# Patient Record
Sex: Female | Born: 1975 | Race: Asian | Hispanic: No | Marital: Married | Smoking: Never smoker
Health system: Southern US, Community
[De-identification: ages and names within clinical notes are randomized; demographics above are authoritative.]

## PROBLEM LIST (undated history)

## (undated) DIAGNOSIS — K219 Gastro-esophageal reflux disease without esophagitis: Secondary | ICD-10-CM

## (undated) DIAGNOSIS — I10 Essential (primary) hypertension: Secondary | ICD-10-CM

## (undated) HISTORY — PX: INTRAUTERINE DEVICE (IUD) INSERTION: SHX5877

## (undated) HISTORY — DX: Essential (primary) hypertension: I10

## (undated) HISTORY — DX: Gastro-esophageal reflux disease without esophagitis: K21.9

---

## 1998-12-08 ENCOUNTER — Emergency Department (HOSPITAL_COMMUNITY): Admission: EM | Admit: 1998-12-08 | Discharge: 1998-12-08 | Payer: Self-pay | Admitting: Emergency Medicine

## 1998-12-08 ENCOUNTER — Encounter: Payer: Self-pay | Admitting: Emergency Medicine

## 1998-12-09 ENCOUNTER — Ambulatory Visit (HOSPITAL_COMMUNITY): Admission: RE | Admit: 1998-12-09 | Discharge: 1998-12-09 | Payer: Self-pay | Admitting: Gynecology

## 1999-01-29 ENCOUNTER — Other Ambulatory Visit: Admission: RE | Admit: 1999-01-29 | Discharge: 1999-01-29 | Payer: Self-pay | Admitting: Gynecology

## 1999-01-29 ENCOUNTER — Encounter: Payer: Self-pay | Admitting: Gynecology

## 1999-01-29 ENCOUNTER — Ambulatory Visit (HOSPITAL_COMMUNITY): Admission: RE | Admit: 1999-01-29 | Discharge: 1999-01-29 | Payer: Self-pay | Admitting: Gynecology

## 1999-08-03 ENCOUNTER — Encounter: Payer: Self-pay | Admitting: *Deleted

## 1999-08-03 ENCOUNTER — Inpatient Hospital Stay (HOSPITAL_COMMUNITY): Admission: AD | Admit: 1999-08-03 | Discharge: 1999-08-03 | Payer: Self-pay | Admitting: *Deleted

## 1999-09-03 ENCOUNTER — Inpatient Hospital Stay (HOSPITAL_COMMUNITY): Admission: AD | Admit: 1999-09-03 | Discharge: 1999-09-03 | Payer: Self-pay | Admitting: Gynecology

## 1999-09-21 ENCOUNTER — Other Ambulatory Visit: Admission: RE | Admit: 1999-09-21 | Discharge: 1999-09-21 | Payer: Self-pay | Admitting: Internal Medicine

## 1999-12-16 ENCOUNTER — Encounter: Admission: RE | Admit: 1999-12-16 | Discharge: 2000-03-15 | Payer: Self-pay | Admitting: Gynecology

## 2000-02-20 ENCOUNTER — Inpatient Hospital Stay (HOSPITAL_COMMUNITY): Admission: AD | Admit: 2000-02-20 | Discharge: 2000-02-20 | Payer: Self-pay | Admitting: Gynecology

## 2000-03-18 ENCOUNTER — Inpatient Hospital Stay (HOSPITAL_COMMUNITY): Admission: AD | Admit: 2000-03-18 | Discharge: 2000-03-18 | Payer: Self-pay | Admitting: Gynecology

## 2000-03-19 ENCOUNTER — Inpatient Hospital Stay (HOSPITAL_COMMUNITY): Admission: AD | Admit: 2000-03-19 | Discharge: 2000-03-21 | Payer: Self-pay | Admitting: Gynecology

## 2000-04-27 ENCOUNTER — Other Ambulatory Visit: Admission: RE | Admit: 2000-04-27 | Discharge: 2000-04-27 | Payer: Self-pay | Admitting: Gynecology

## 2002-07-17 ENCOUNTER — Other Ambulatory Visit: Admission: RE | Admit: 2002-07-17 | Discharge: 2002-07-17 | Payer: Self-pay | Admitting: Gynecology

## 2005-02-21 ENCOUNTER — Ambulatory Visit (HOSPITAL_COMMUNITY): Admission: RE | Admit: 2005-02-21 | Discharge: 2005-02-21 | Payer: Self-pay | Admitting: Family Medicine

## 2005-02-21 IMAGING — US US PELVIS COMPLETE MODIFY
1 series · 14 of 25 positions shown · non-contrast
Comparison: none

CLINICAL DATA: Evaluate for bleeding for retained products of conception.   Quantitative Beta HCG on [DATE] of 24 and a quantitative Beta HCG on [DATE] of 13.
 PELVIC ULTRASOUND WITH TRANSVAGINAL MODIFIER:
 Multiple images of the uterus and adnexa were obtained using transabdominal and endovaginal approaches.   The uterus has a sagittal length of 7.9 cm, and AP width of 4.1 cm and a transverse width of 5.8 cm.   A homogeneous uterine myometrium is seen.  The endometrial canal is thin with an AP width of 5.5 mm.   No definite areas of focal thickening or inhomogeneity are noted to suggest presence of retained products of conception.   
 Both ovaries are seen and have a normal appearance with the left ovary measuring 3.2 x 3.3 x 1.6 cm and the right ovary measuring 3.1 x 1.9 x 2.7 cm.   No cul-de-sac or periovarian fluid is seen and no separate adnexal masses are noted.

[Series 1: us pelvis complete modify · 0.33mm/px · 14 of 38 slices shown]
[im 1/38]
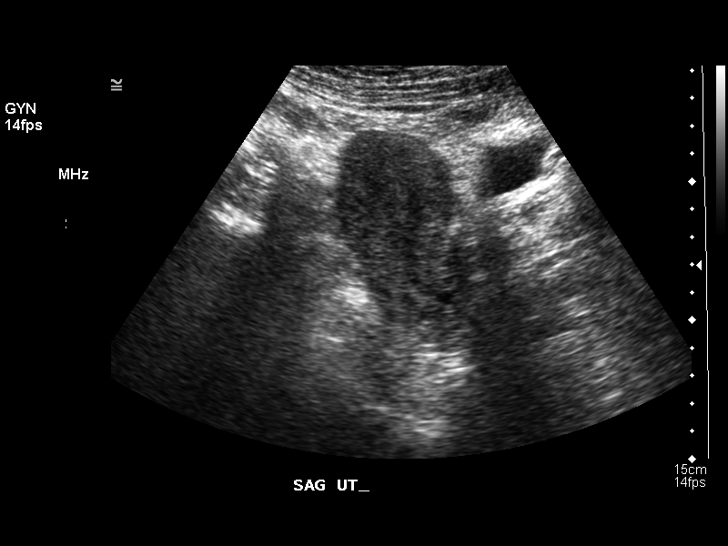
[im 4/38]
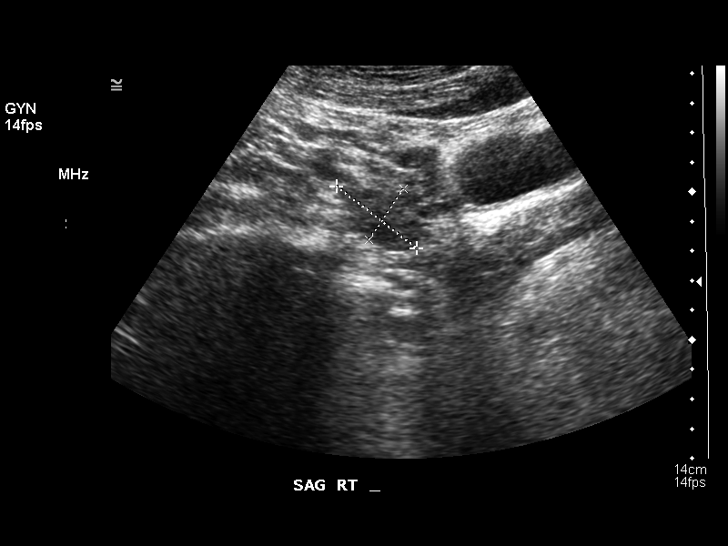
[im 7/38]
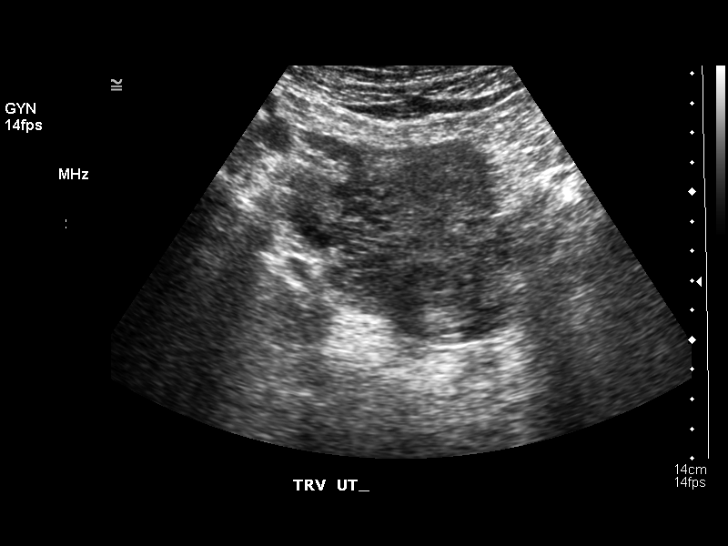
[im 10/38]
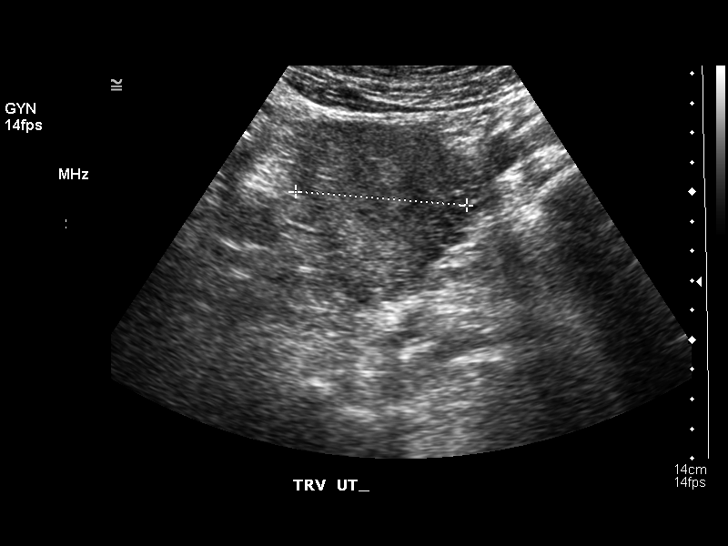
[im 13/38]
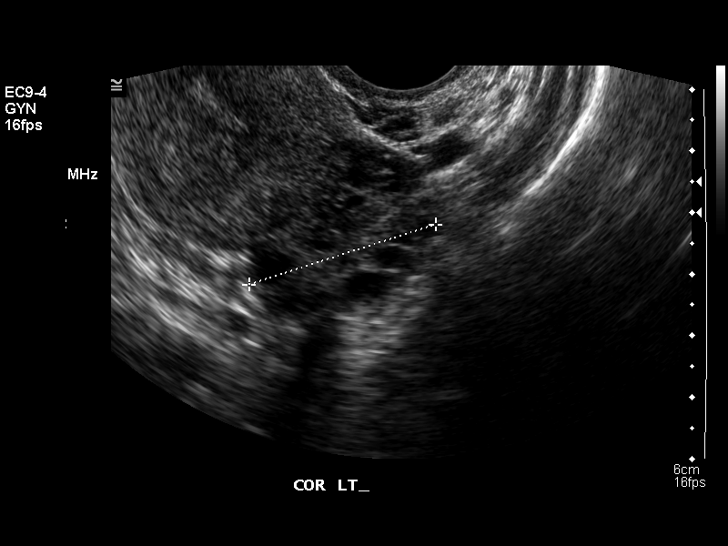
[im 14/38]
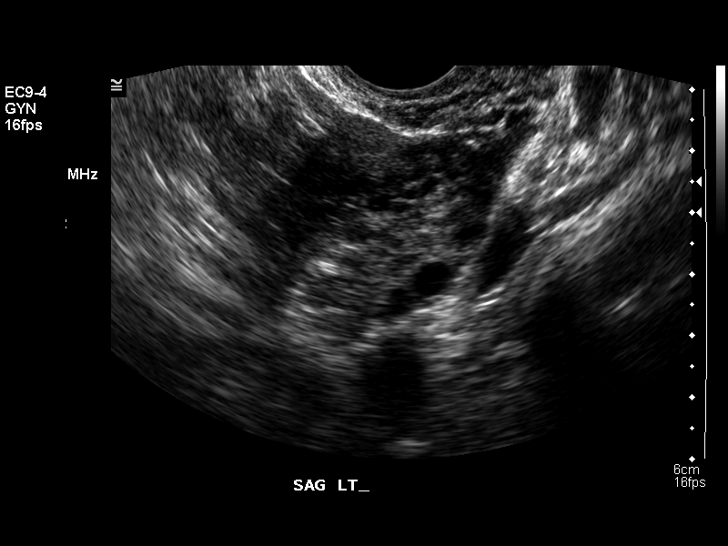
[im 17/38]
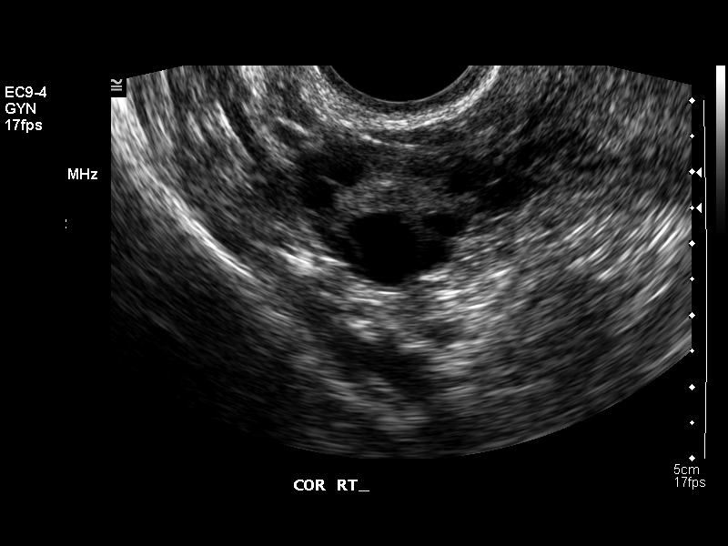
[im 21/38]
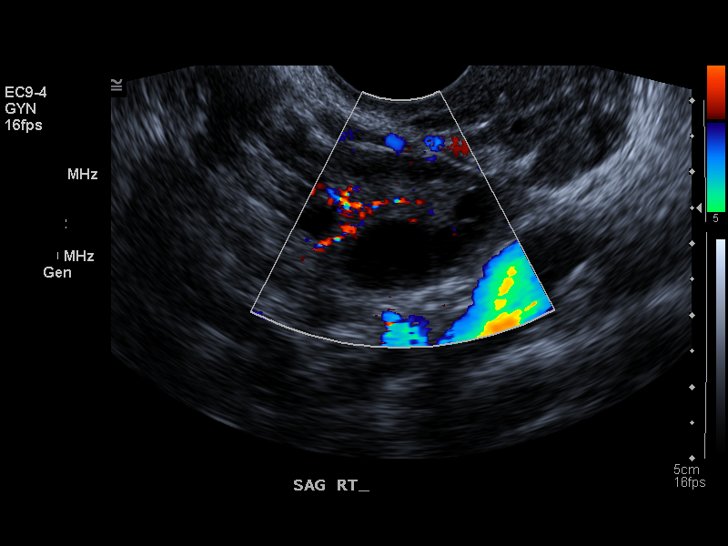
[im 24/38]
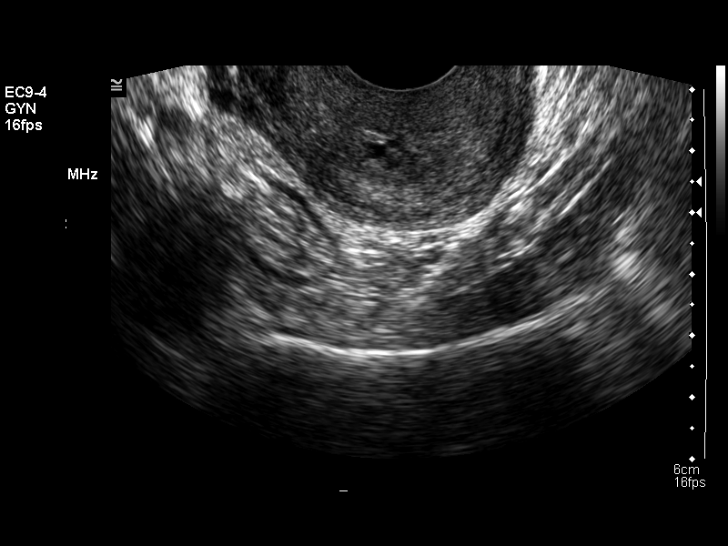
[im 25/38]
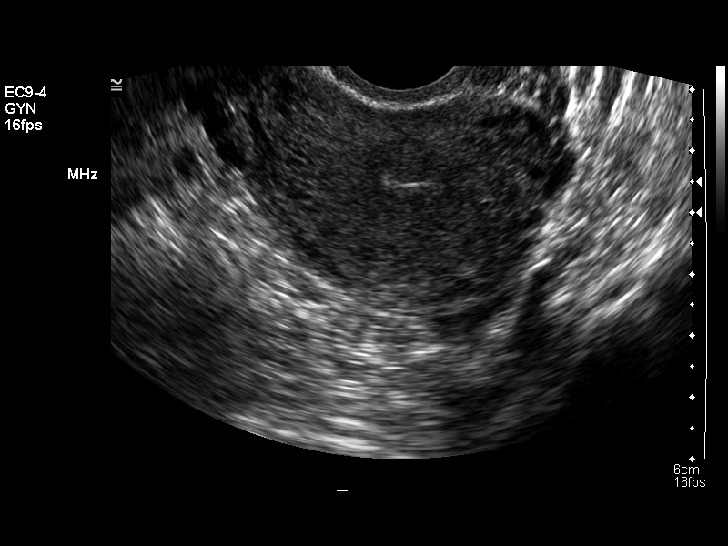
[im 28/38]
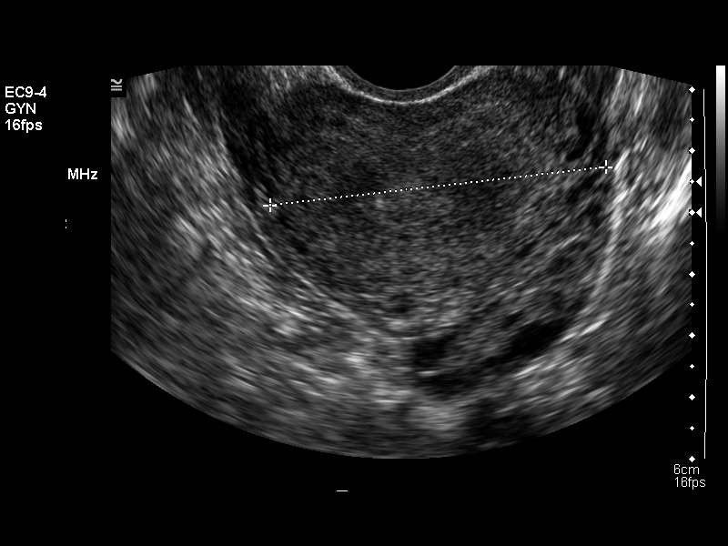
[im 31/38]
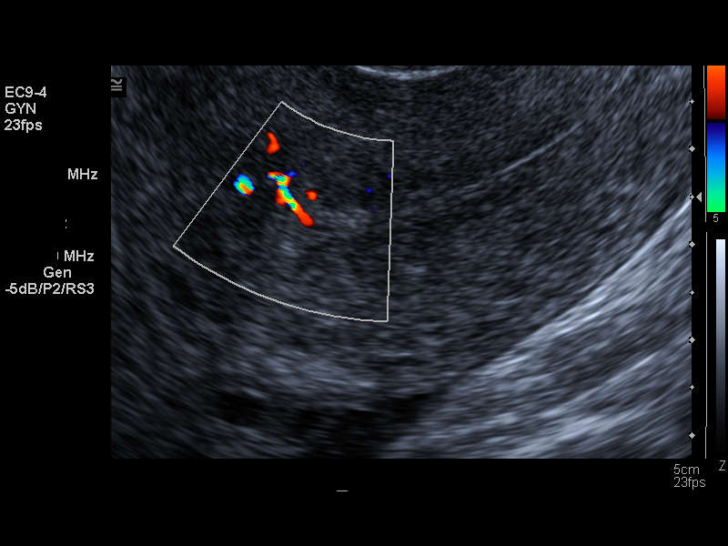
[im 34/38]
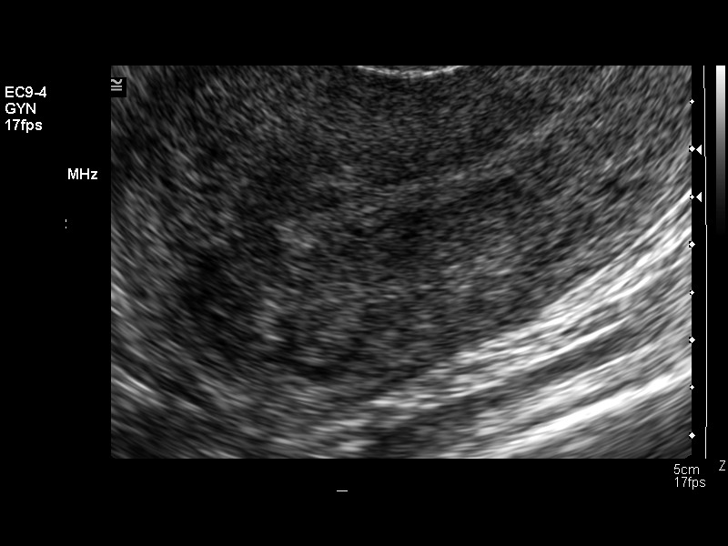
[im 38/38]
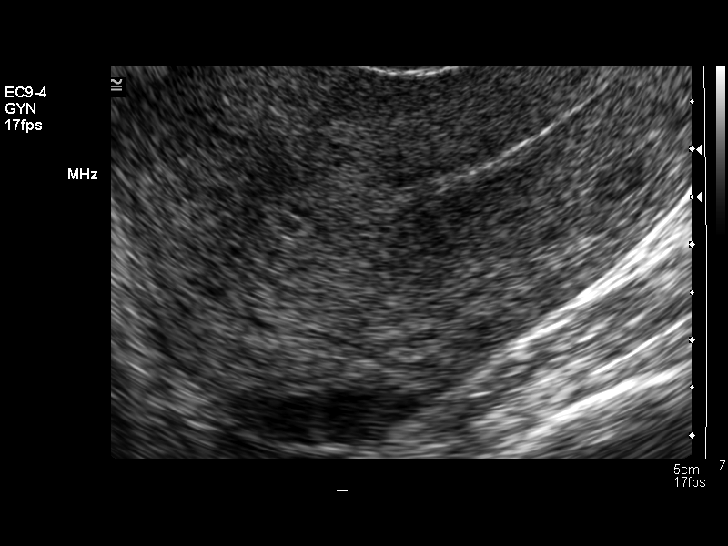

[14 of 25 positions shown; findings below may reference images not displayed]

IMPRESSION: No evidence for retained products of conception.

## 2007-12-06 ENCOUNTER — Inpatient Hospital Stay (HOSPITAL_COMMUNITY): Admission: AD | Admit: 2007-12-06 | Discharge: 2007-12-06 | Payer: Self-pay | Admitting: Obstetrics and Gynecology

## 2007-12-06 IMAGING — US US OB COMP LESS 14 WK
1 series · 14 of 28 positions shown · non-contrast
Comparison: None

CLINICAL DATA: Pregnancy; ; RULE OUT ECTOPIC

OBSTETRIC <14 WK US AND TRANSVAGINAL OB US
TECHNIQUE: Both transabdominal and transvaginal ultrasound
examinations were performed for complete evaluation of the
gestation as well as the maternal uterus, adnexal regions, and
pelvic cul-de-sac.

[Series 1: us ob comp less 14 wk · 0.23mm/px · 14 of 34 slices shown]
[im 2/34]
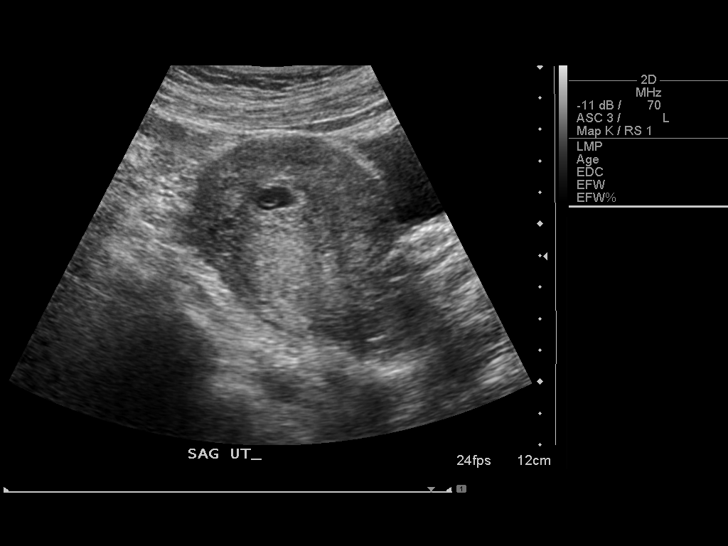
[im 4/34]
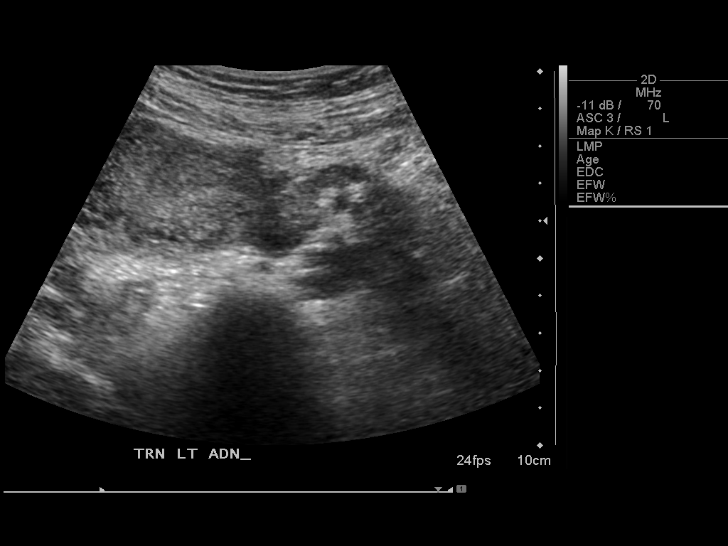
[im 7/34]
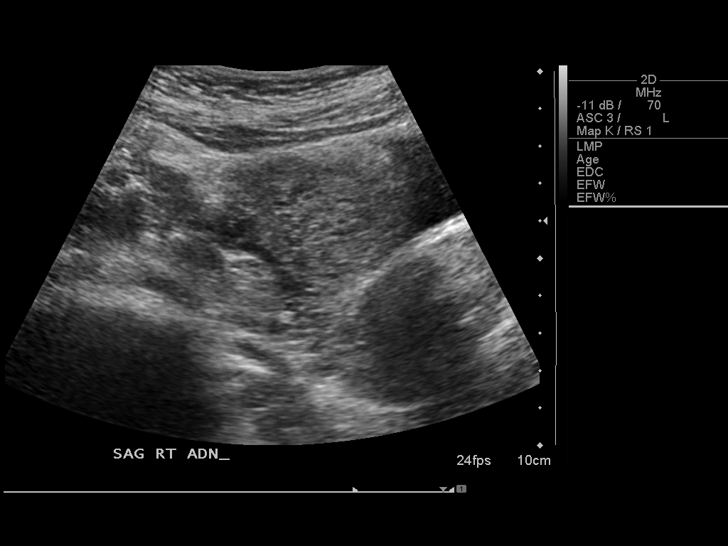
[im 9/34]
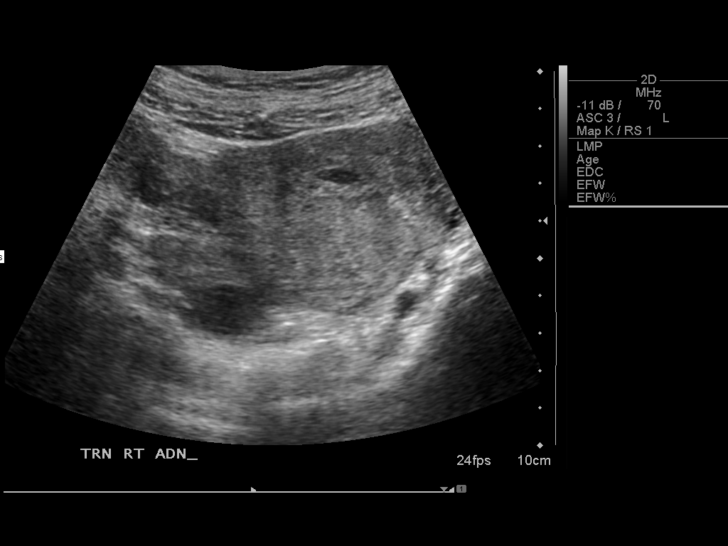
[im 12/34]
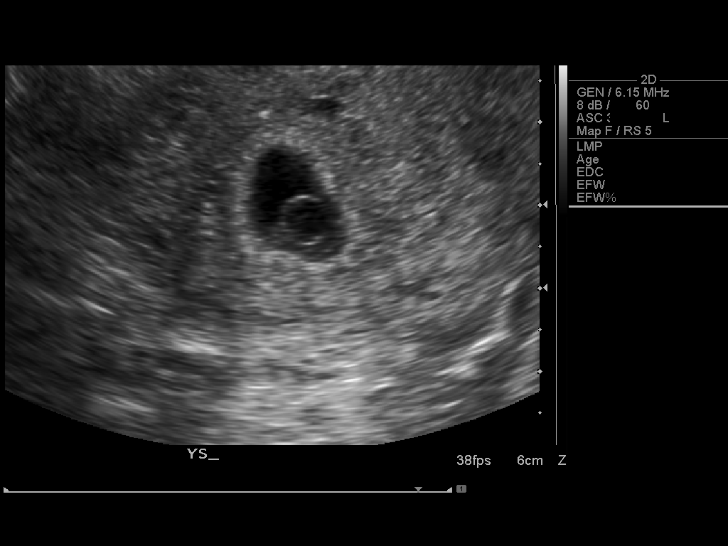
[im 14/34]
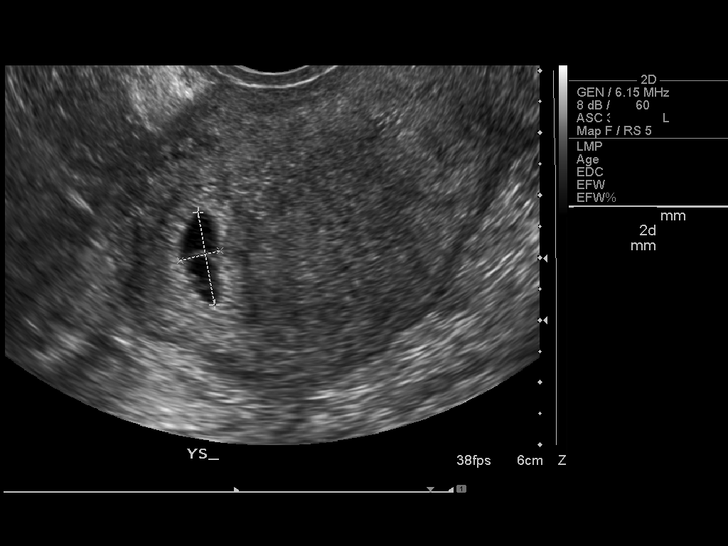
[im 16/34]
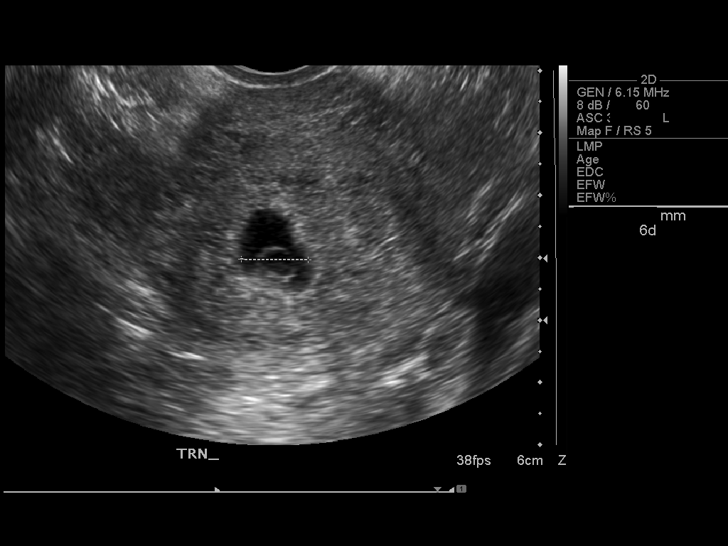
[im 19/34]
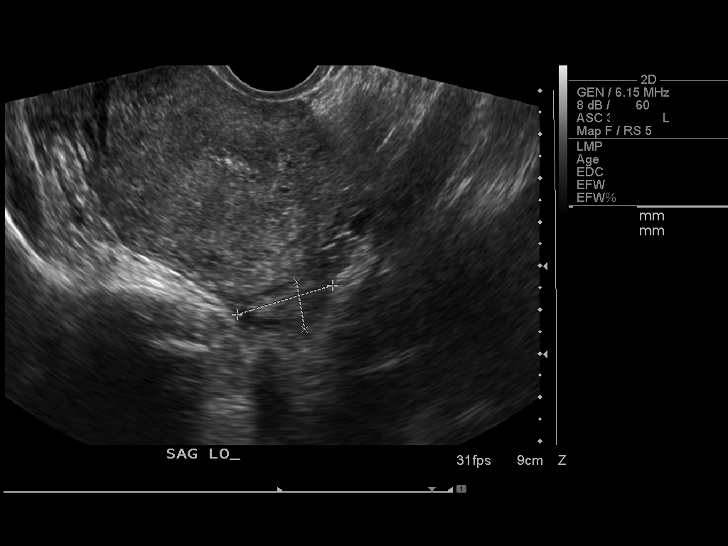
[im 21/34]
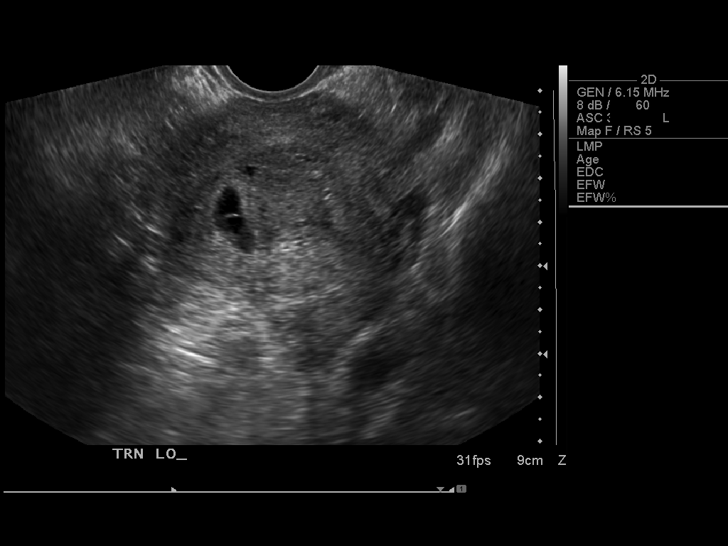
[im 24/34]
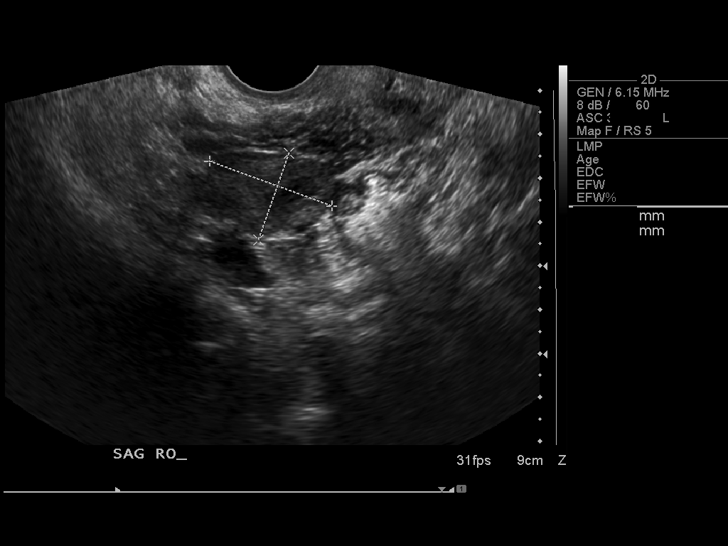
[im 26/34]
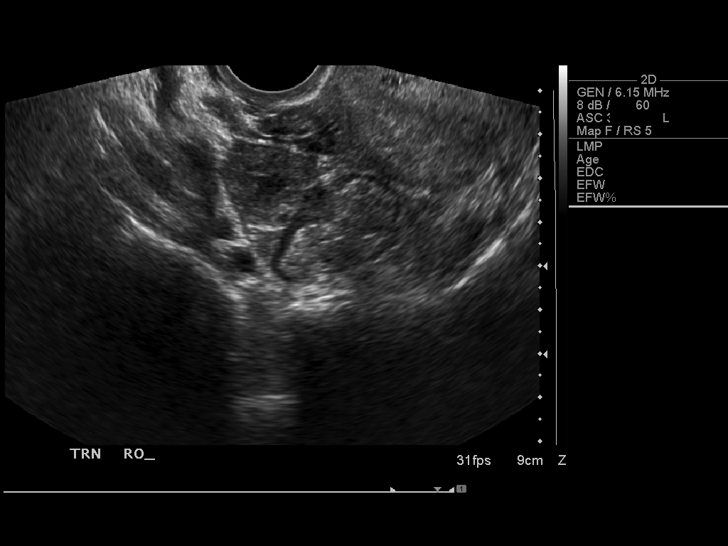
[im 29/34]
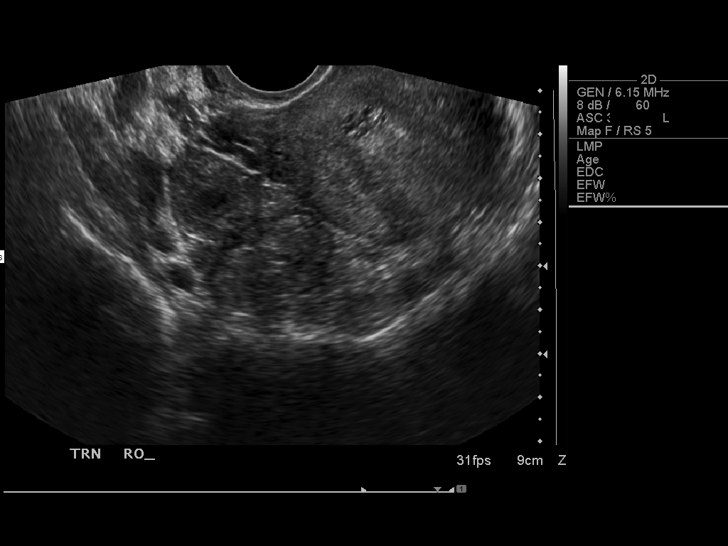
[im 31/34]
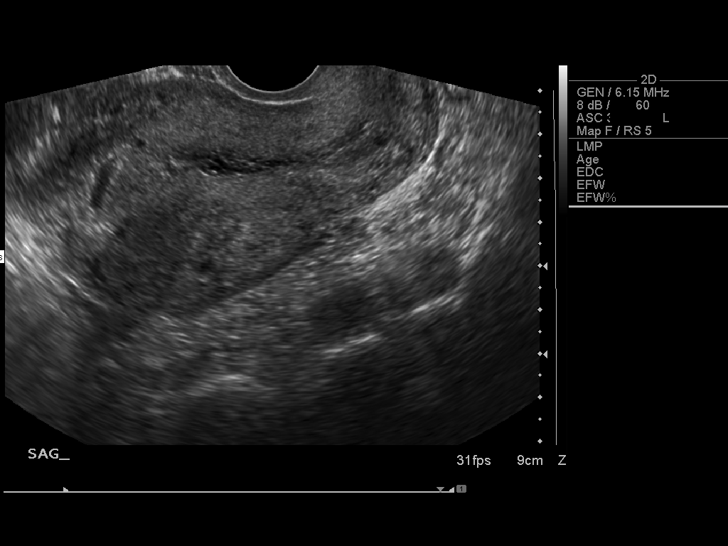
[im 34/34]
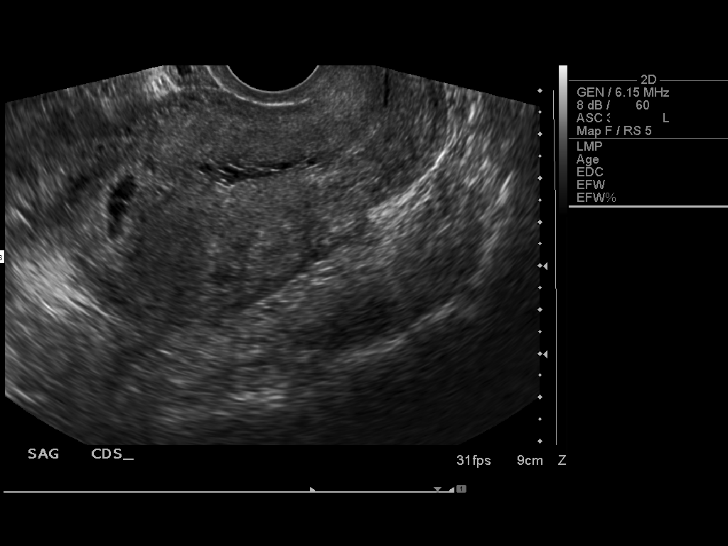

[14 of 28 positions shown; findings below may reference images not displayed]

FINDINGS: There is a single intrauterine gestation.  Based on mean
sac diameter of 12 mm.  Estimated gestational age is 5 weeks 6
days.  Yolk sac is identified.  Fetal pole not visualized at this
time.  No subchorionic hemorrhage.

Ovaries are normal.  No free fluid.
IMPRESSION: Early intrauterine pregnancy, 5 weeks 6 days.  Yolk sac present but
a fetal pole not yet identified.  No complicating features at this
time.

## 2008-01-10 ENCOUNTER — Ambulatory Visit (HOSPITAL_COMMUNITY): Admission: RE | Admit: 2008-01-10 | Discharge: 2008-01-10 | Payer: Self-pay | Admitting: Gastroenterology

## 2008-01-10 IMAGING — US US ABDOMEN COMPLETE
1 series · 13 of 25 positions shown · non-contrast
Comparison: None.

CLINICAL DATA: 32-year-old female with abdominal pain.

ABDOMEN ULTRASOUND
TECHNIQUE: Complete abdominal ultrasound examination was performed
including evaluation of the liver, gallbladder, bile ducts,
pancreas, kidneys, spleen, IVC, and abdominal aorta.

[Series 1: unknown · 0.33mm/px · 13 of 65 slices shown]
[im 1/65]
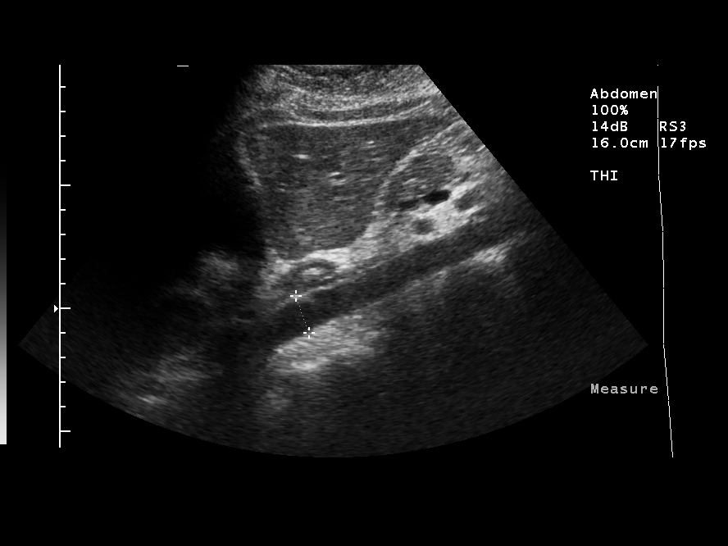
[im 6/65]
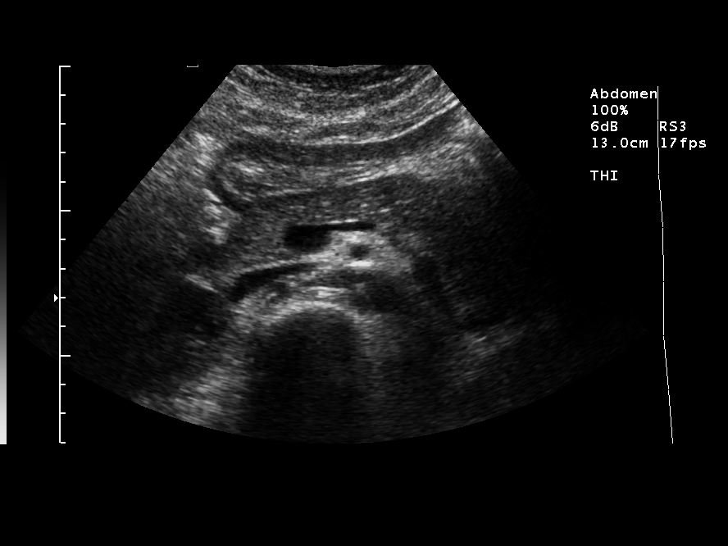
[im 11/65]
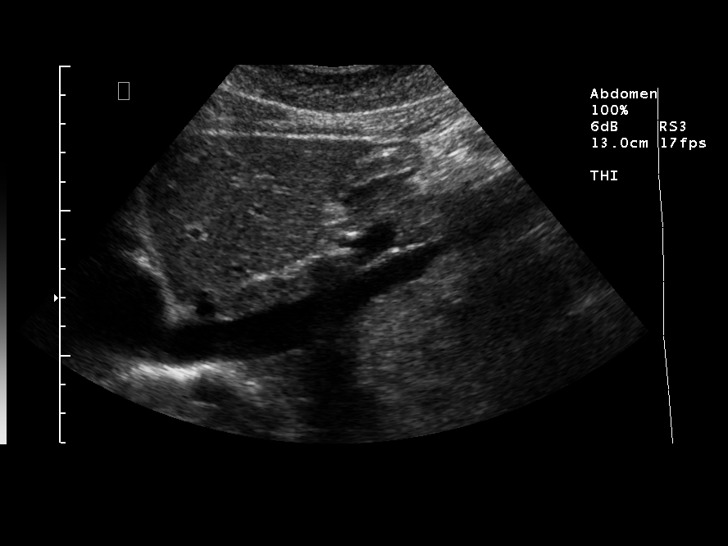
[im 17/65]
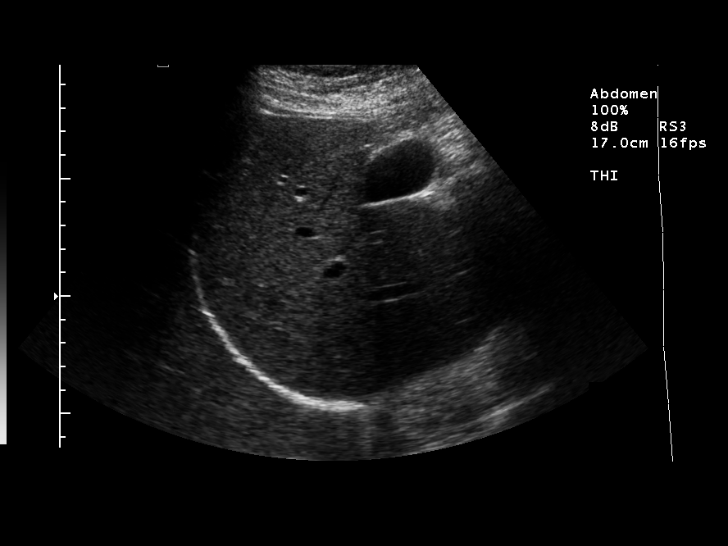
[im 22/65]
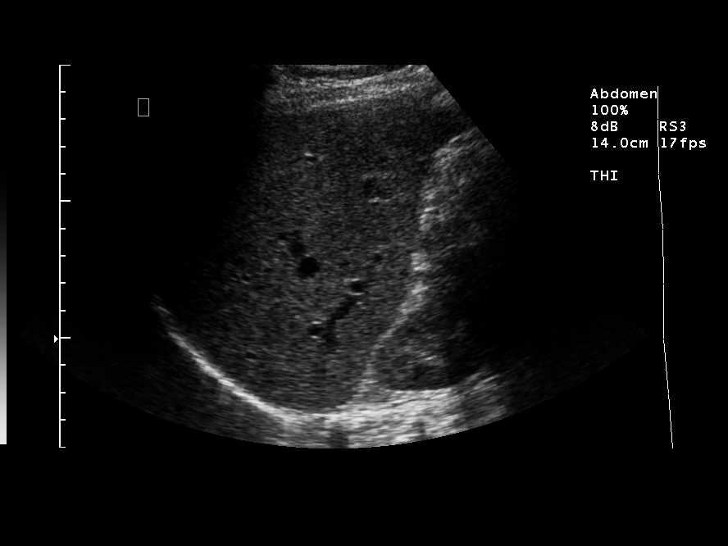
[im 27/65]
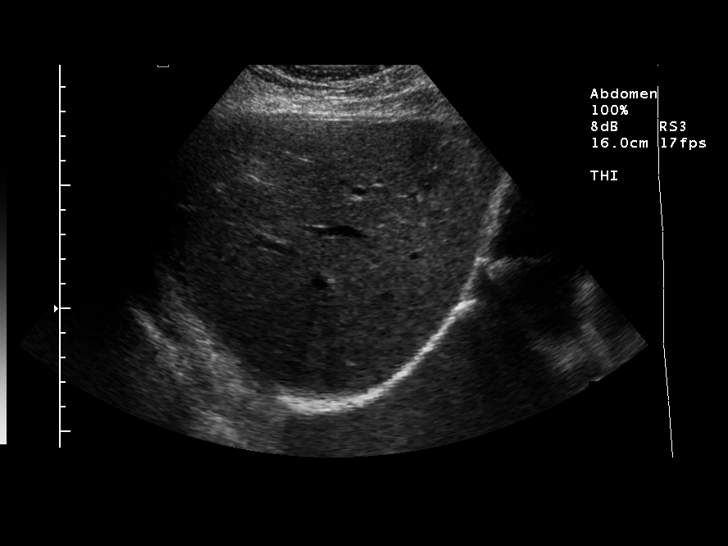
[im 33/65]
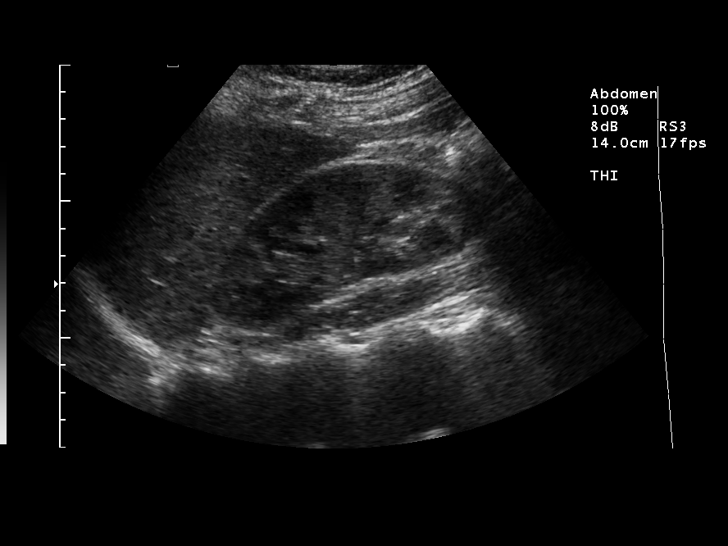
[im 38/65]
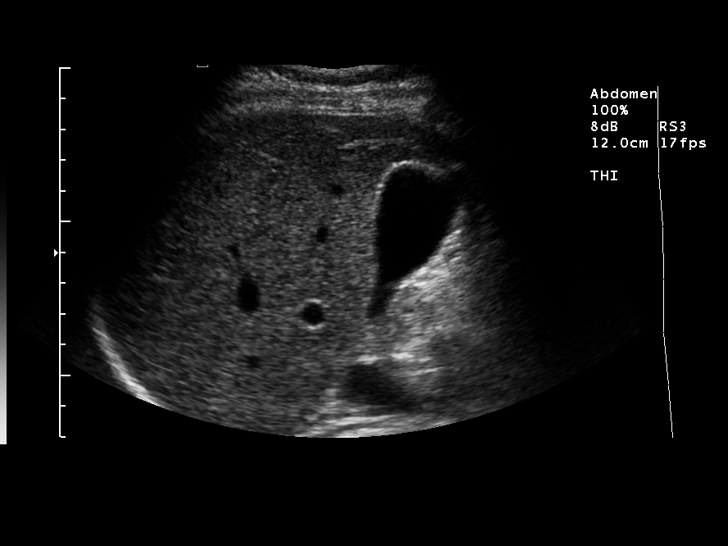
[im 43/65]
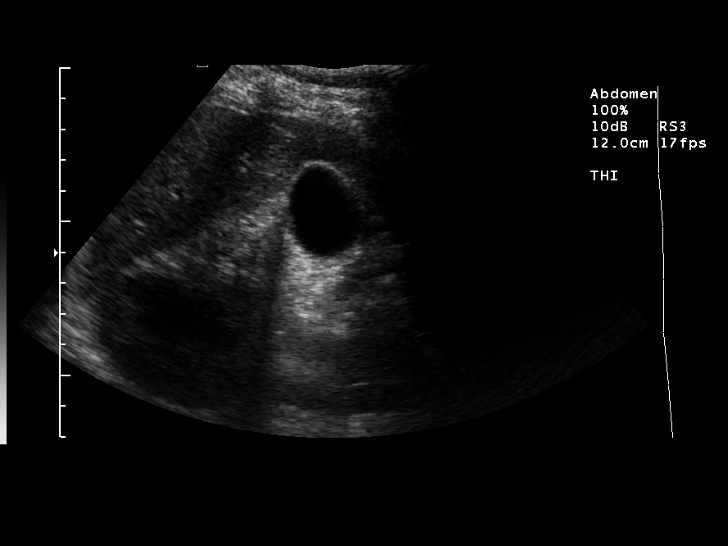
[im 49/65]
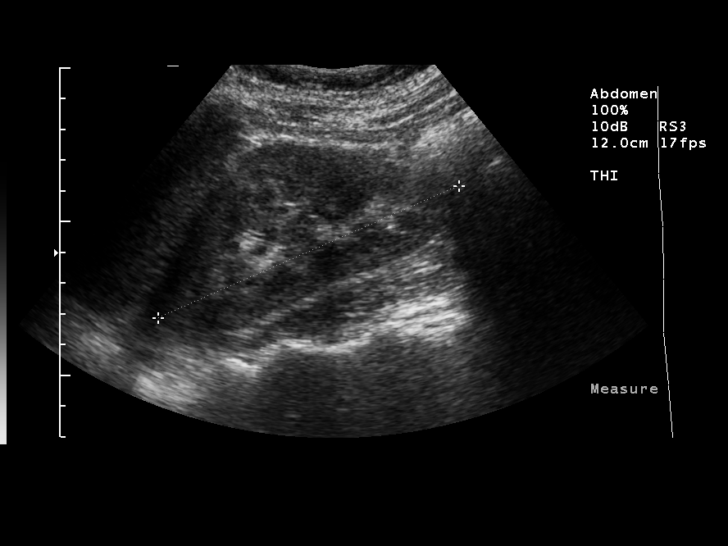
[im 54/65]
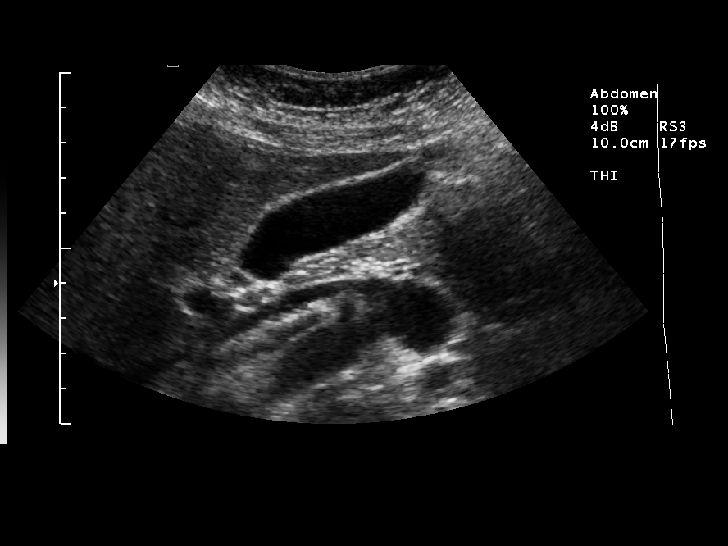
[im 59/65]
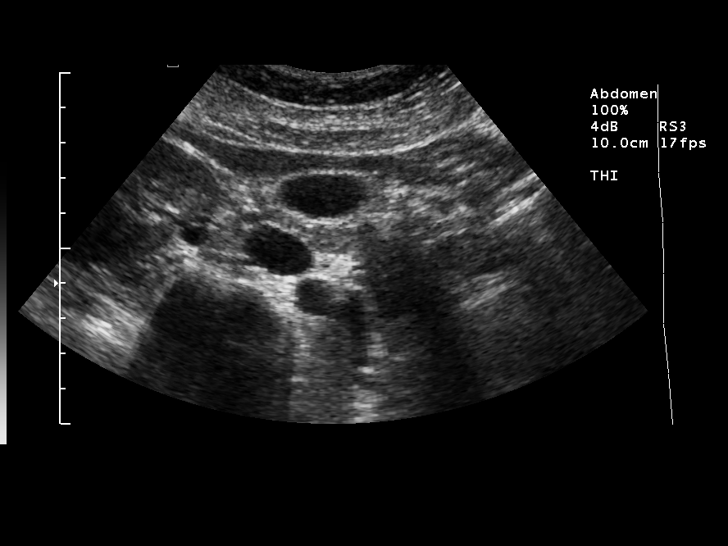
[im 65/65]
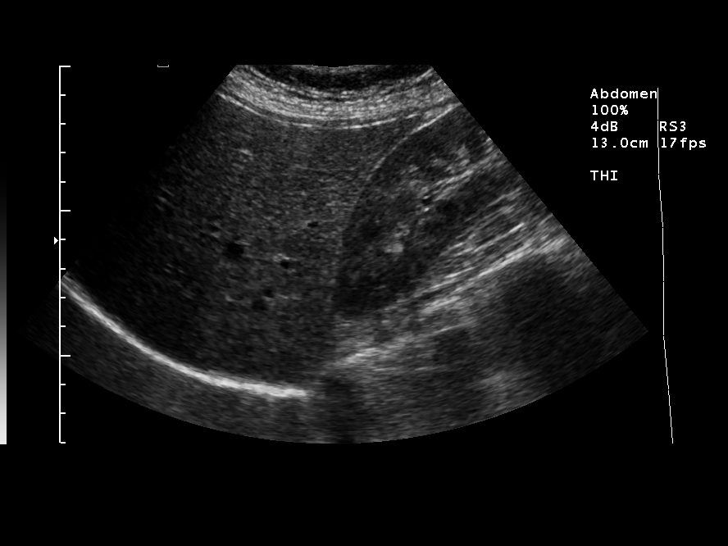

[13 of 25 positions shown; findings below may reference images not displayed]

FINDINGS: Visualized abdominal aorta is normal measuring up to
cm in diameter.  Decreased echotexture of the pancreas with respect
to the liver.  This could reflect pancreatic edema.  No free fluid.
Lesser sac region otherwise appears within normal limits.
Visualized inferior vena cava is normal.  Hepatic echotexture is
within normal limits.  The right kidney is not obstructed measuring
11.3 cm in length with normal echotexture and corticomedullary
differentiation.  The gallbladder wall is not thickened 1.9 mm.
The gallbladder lumen is free of echogenic sludge or stones.  The
spleen is normal measuring 7.9 cm in length.  The left kidney is
not obstructed measuring 10.7 cm in length and has normal
echotexture.  Common bile duct is not enlarged at 3.1 mm in
diameter.
IMPRESSION: 1.  Decreased pancreatic echotexture raises the possibility of
edema such as with pancreatitis.  Correlation with amylase and
lipase recommended.
2.  Normal gallbladder, otherwise no acute findings.

## 2008-05-07 ENCOUNTER — Ambulatory Visit (HOSPITAL_COMMUNITY): Admission: RE | Admit: 2008-05-07 | Discharge: 2008-05-07 | Payer: Self-pay | Admitting: Gastroenterology

## 2008-05-07 IMAGING — NM NM HEPATO W/GB/PHARM/[PERSON_NAME]
2 series · 12 of 12 positions shown · non-contrast
Comparison: None

CLINICAL DATA: Abdominal pain.

NUCLEAR MEDICINE HEPATOBILIARY IMAGING WITH GALLBLADDER EF
TECHNIQUE: Sequential images of the abdomen were obtained [DATE] minutes following intravenous administration of
radiopharmaceutical. After oral ingestion of 8oz of half and half
cream, gallbladder ejection fraction was determined.
Radiopharmaceutical:  [0M] [0M] Choletec

[Series 1: he hepato · 4.75mm/px · 6 of 60 frames shown (1 of 2)]
[frame 6/60]
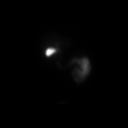
[frame 16/60]
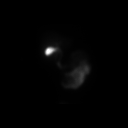
[frame 26/60]
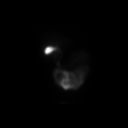
[frame 36/60]
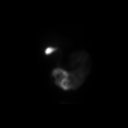
[frame 46/60]
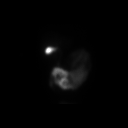
[frame 56/60]
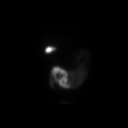

[Series 1: he hepato · 4.75mm/px · 6 of 60 frames shown (2 of 2)]
[frame 6/60]
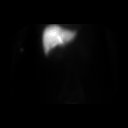
[frame 16/60]
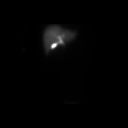
[frame 26/60]
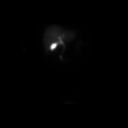
[frame 36/60]
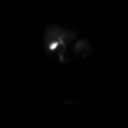
[frame 46/60]
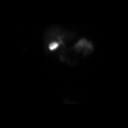
[frame 56/60]
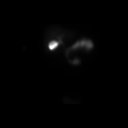

[12 of 12 positions shown; findings below may reference images not displayed]

FINDINGS: There is prompt and uniform uptake in the liver.
Gallbladder is visualized at 15 minutes.  Bowel activity is seen at
30 minutes.  Gallbladder ejection fraction is 48% (normal is
greater than 50%).

The patient did not experience symptoms after oral ingestion of
Half-and-Half cream. ]
IMPRESSION: 1.  No acute findings.
2.  Minimally decreased gallbladder ejection fraction.

## 2009-09-28 ENCOUNTER — Encounter: Admission: RE | Admit: 2009-09-28 | Discharge: 2009-09-28 | Payer: Self-pay | Admitting: Specialist

## 2009-09-28 IMAGING — US US PELVIS COMPLETE
1 series · 14 of 25 positions shown · non-contrast
Comparison: None.

CLINICAL DATA: Amenorrhea.

TRANSABDOMINAL AND TRANSVAGINAL ULTRASOUND OF PELVIS
TECHNIQUE: Both transabdominal and transvaginal ultrasound
examinations of the pelvis were performed including evaluation of
the uterus, ovaries, adnexal regions, and pelvic cul-de-sac.

[Series 1: us pelvis complete · 0.26mm/px · 14 of 63 slices shown]
[im 1/63]
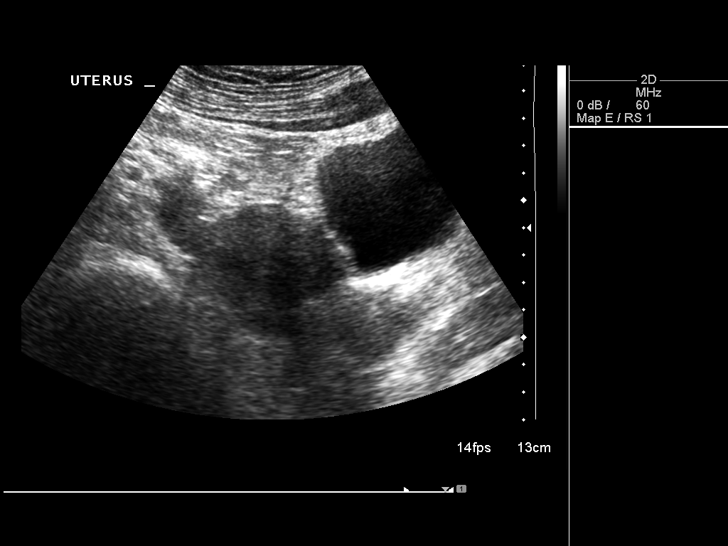
[im 6/63]
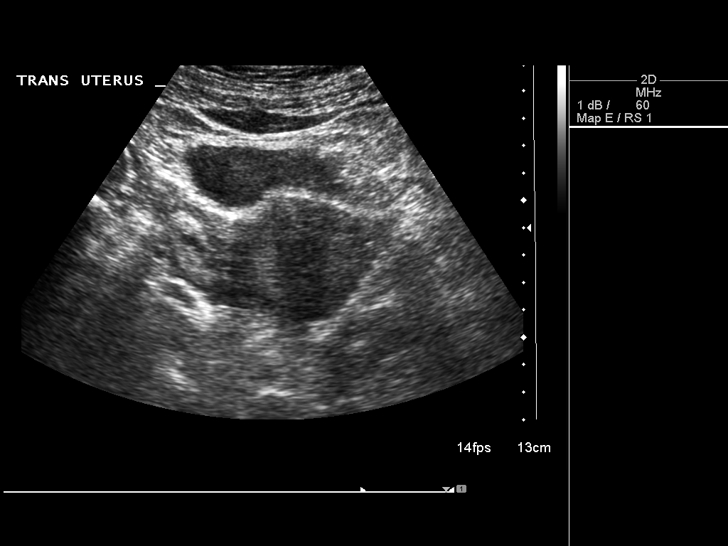
[im 11/63]
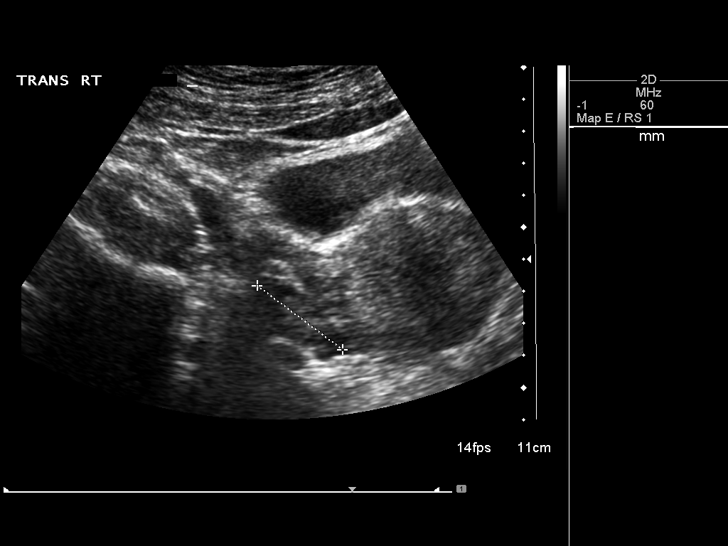
[im 16/63]
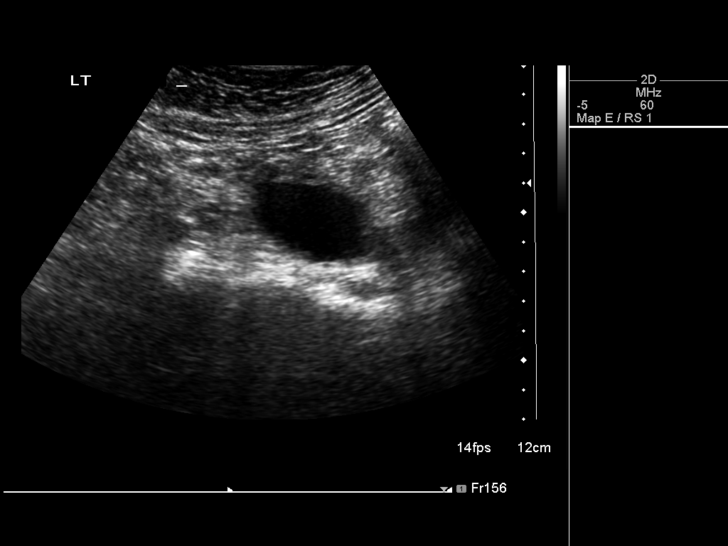
[im 21/63]
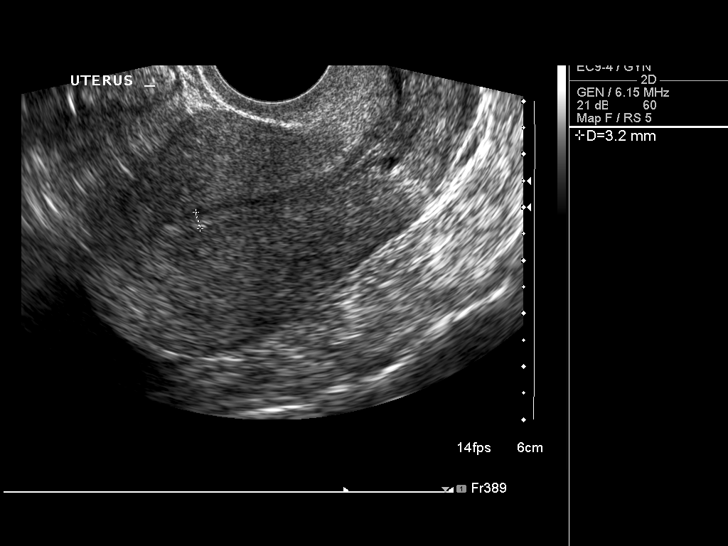
[im 24/63]
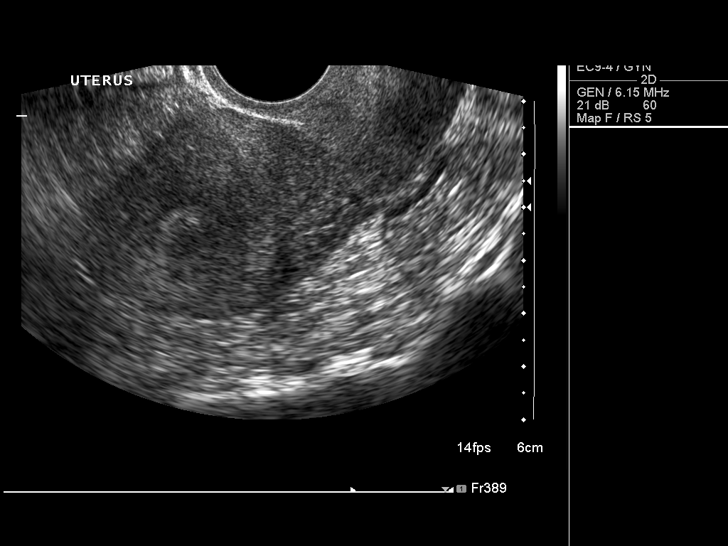
[im 29/63]
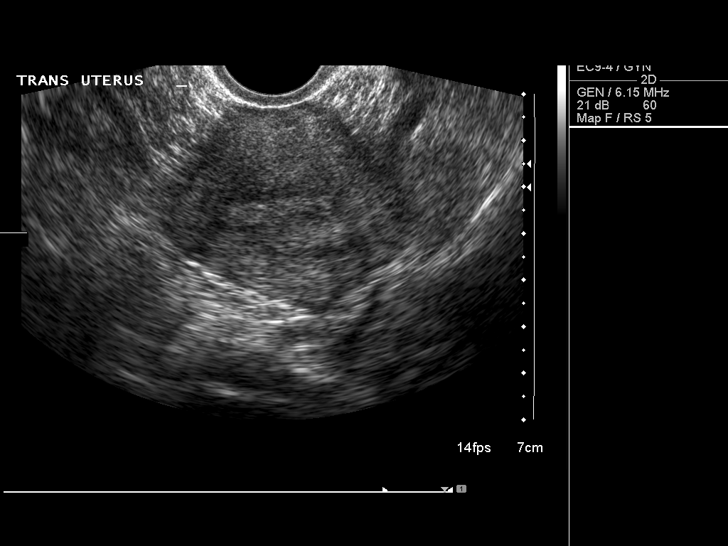
[im 34/63]
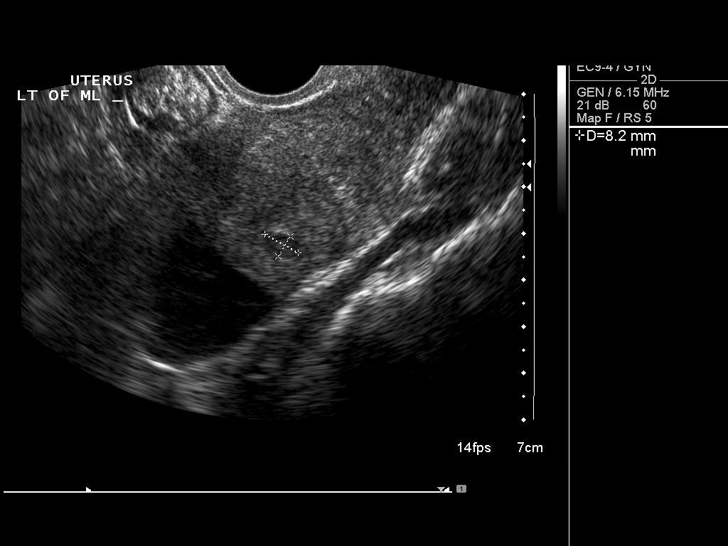
[im 39/63]
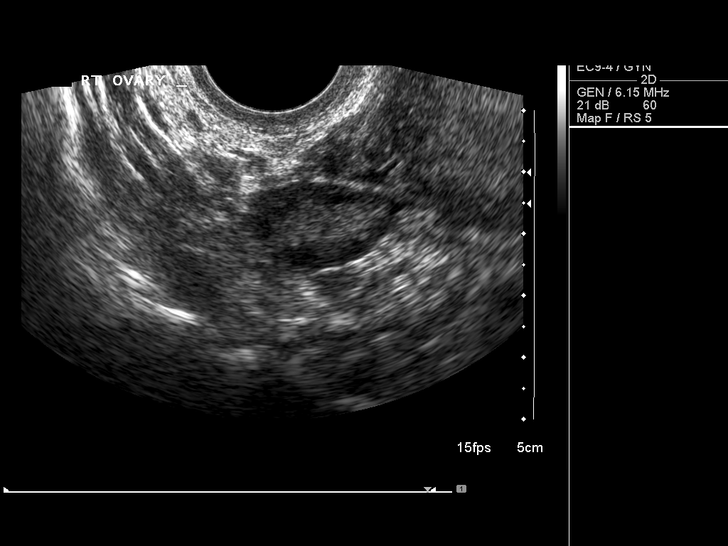
[im 42/63]
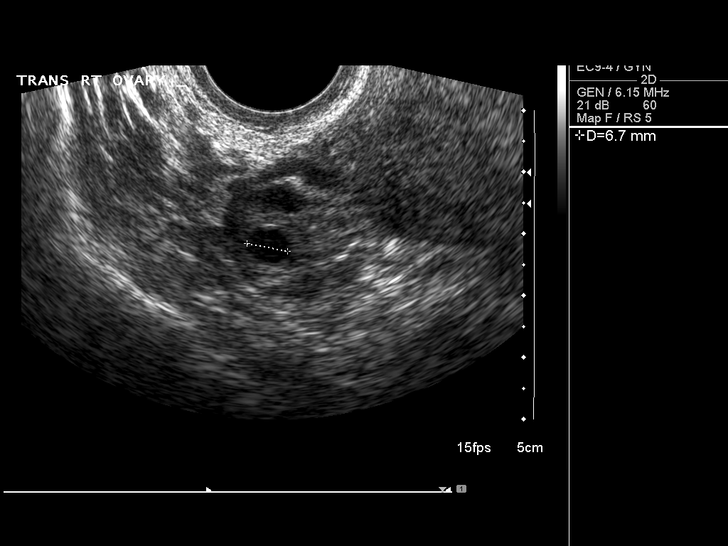
[im 47/63]
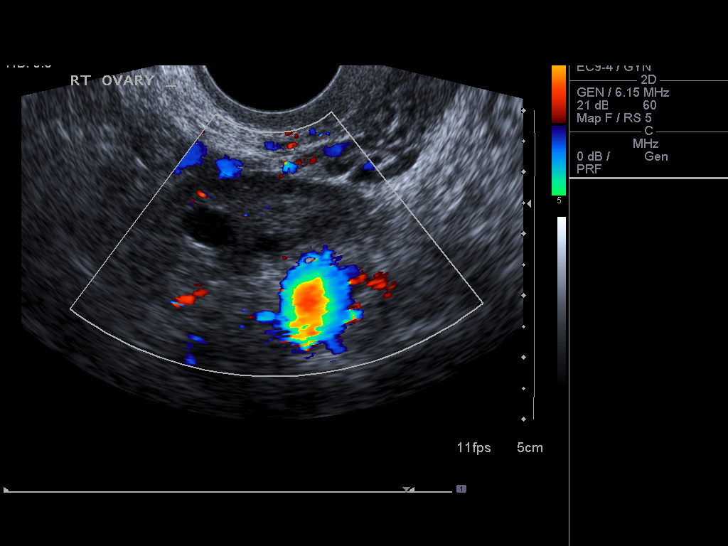
[im 52/63]
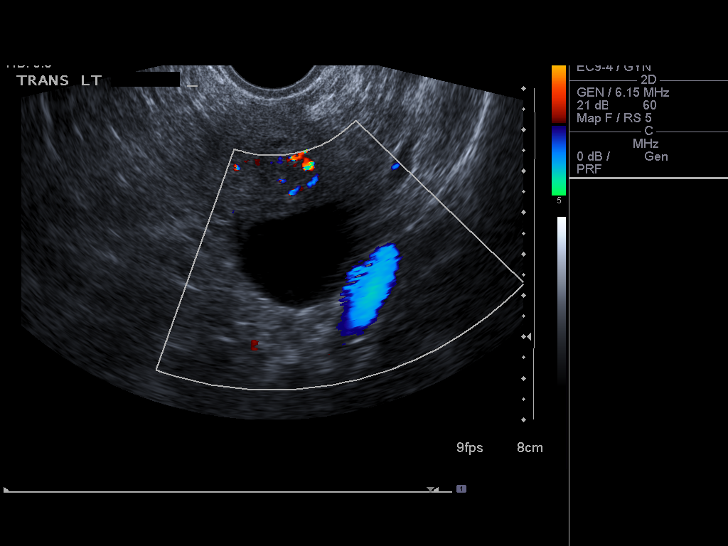
[im 57/63]
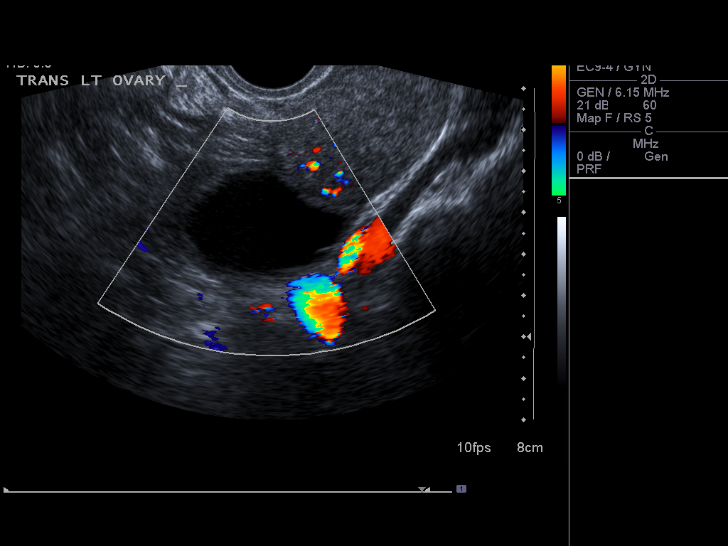
[im 63/63]
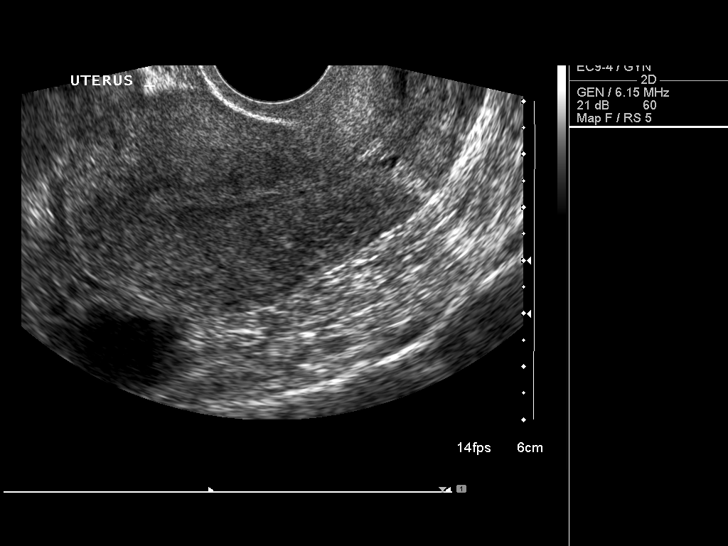

[14 of 25 positions shown; findings below may reference images not displayed]

FINDINGS: Uterus measures 8.2 x 4.8 x 5.6 cm.  There may be a 1 cm fibroid in
the fundus.

Endometrium measures 3 mm, within normal limits.

Right Ovary measures 3.4 x 1.7 x 2.5 cm, negative.

Left Ovary measures 4.4 x 3.3 x 3.6 cm and contains a 4.2 x 3.0 x
3.6 cm simple cyst.

Other Findings:  No free fluid.
IMPRESSION: No acute findings.  Question small fibroid.

## 2010-07-04 ENCOUNTER — Inpatient Hospital Stay (HOSPITAL_COMMUNITY)
Admission: AD | Admit: 2010-07-04 | Discharge: 2010-07-04 | Payer: Self-pay | Source: Home / Self Care | Attending: Family Medicine | Admitting: Family Medicine

## 2010-07-04 IMAGING — US US OB COMP LESS 14 WK
1 series · 13 of 28 positions shown · non-contrast
Comparison: none

[Series 1: us ob comp less 14 wks · 0.20mm/px · 13 of 54 slices shown]
[im 2/54]
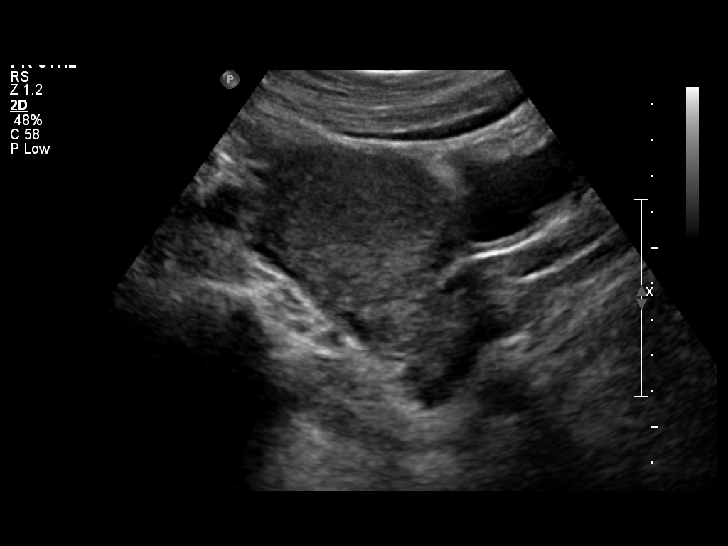
[im 6/54]
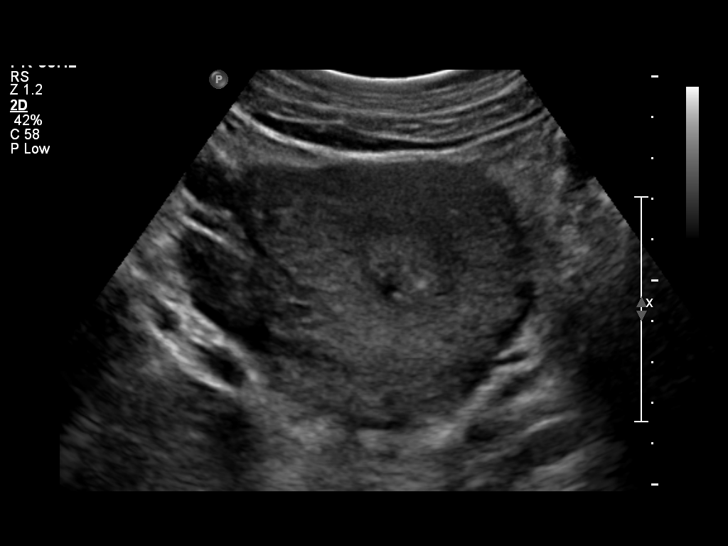
[im 10/54]
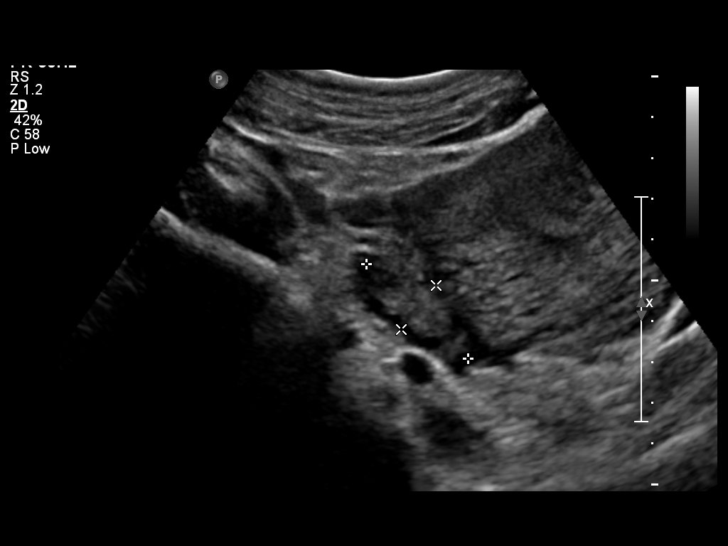
[im 14/54]
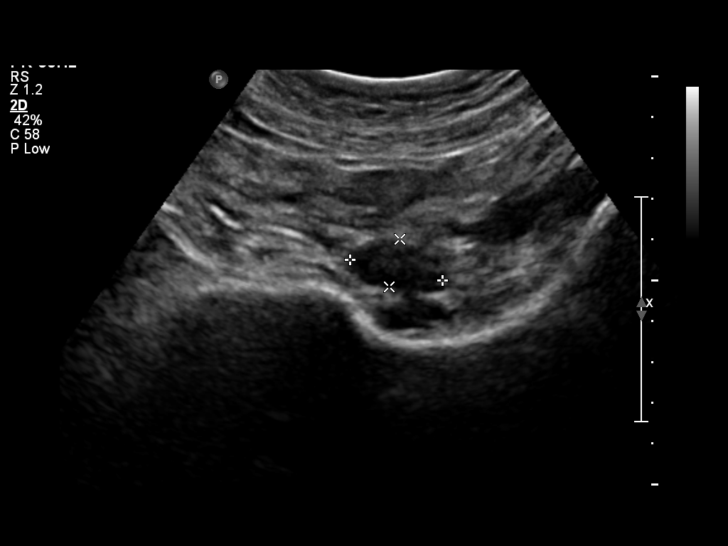
[im 18/54]
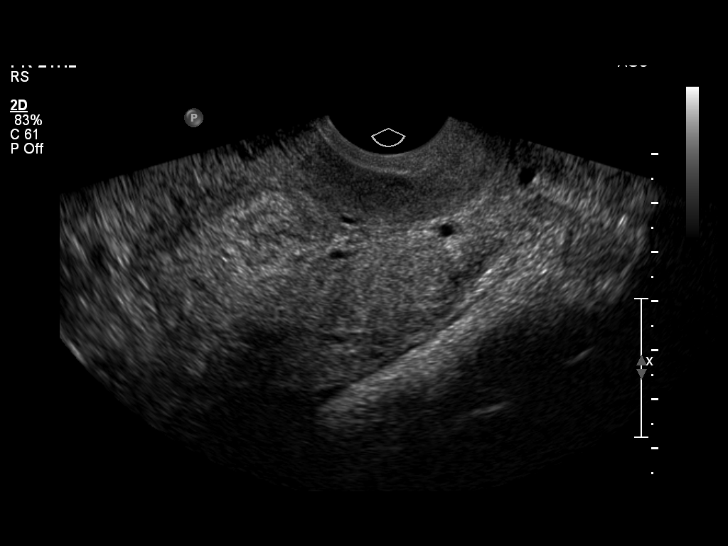
[im 22/54]
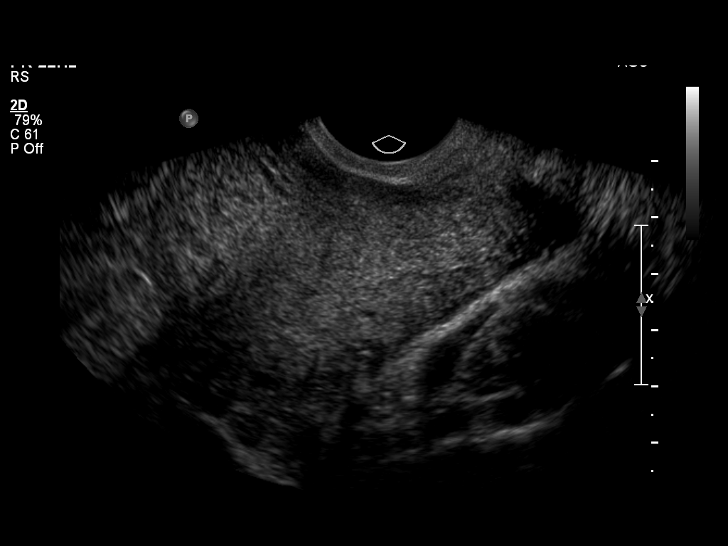
[im 28/54]
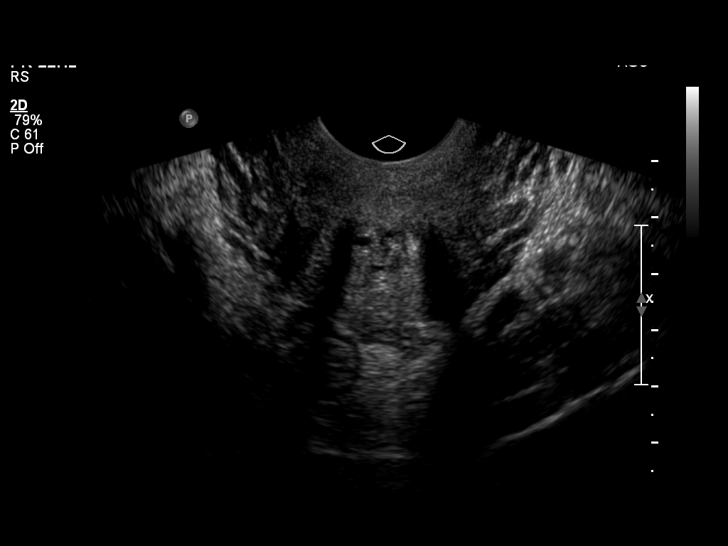
[im 32/54]
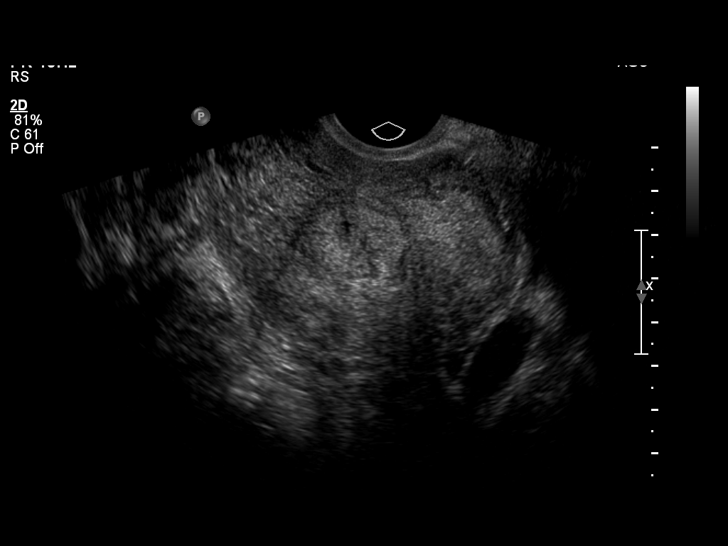
[im 36/54]
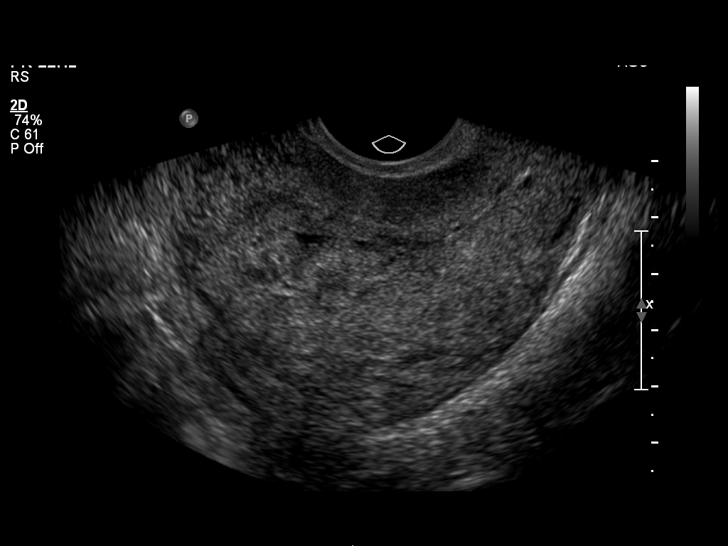
[im 40/54]
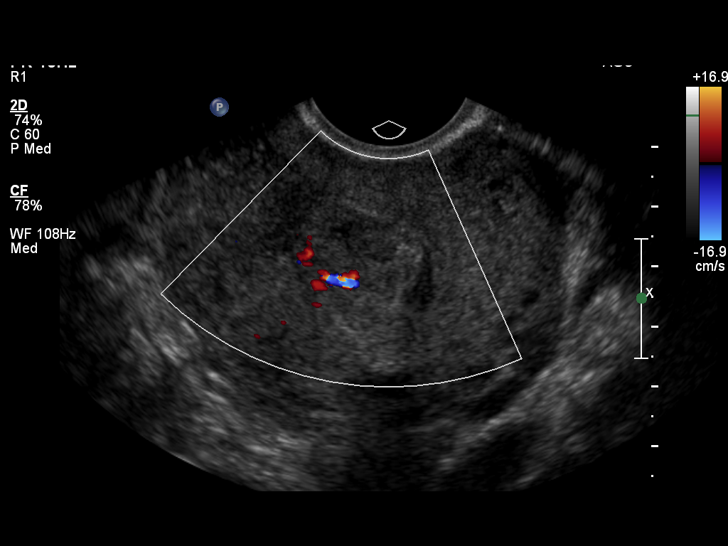
[im 44/54]
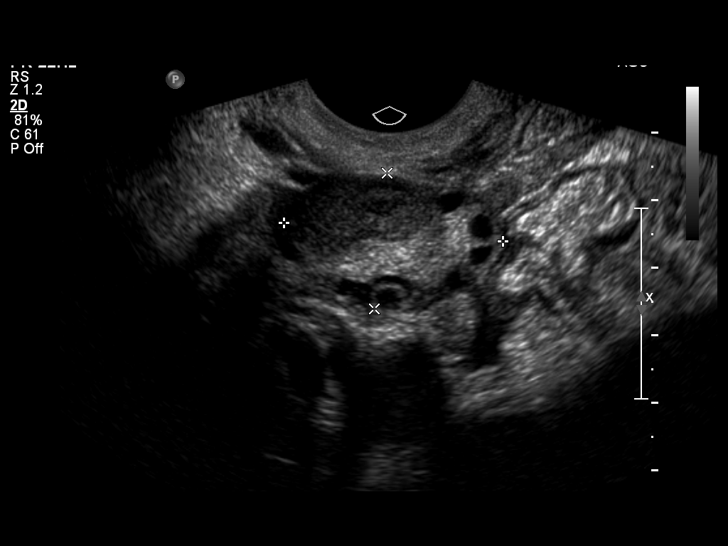
[im 48/54]
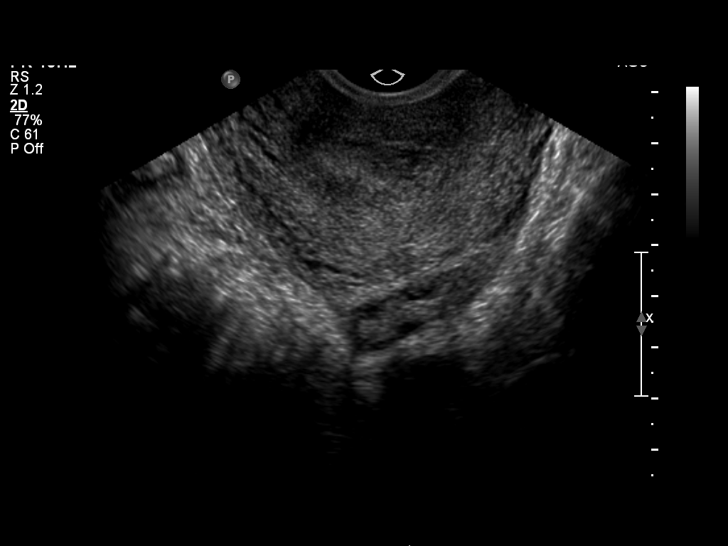
[im 52/54]
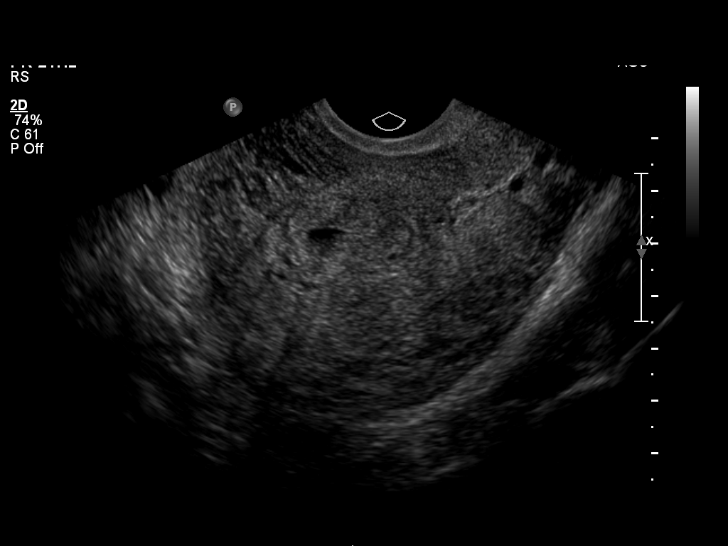

[13 of 28 positions shown; findings below may reference images not displayed]

OBSTETRICS REPORT
                      (Signed Final [DATE] [DATE])

Procedures

 US OB COMP LESS 14 WKS                                76801.0
 US OB Transvaginal Modify                             [LU]
Indications

 Vaginal bleeding, unknown etiology
 Pain - Abdominal/Pelvic
 Uncertain LMP;  Establish Gestational [AGE]
Fetal Evaluation

 Gest. Sac:         None seen
 Yolk Sac:          Not visualized
 Fetal Pole:        Not visualized
 Cardiac Activity:  No embryo visualized
Cervix Uterus Adnexa

 Cervix:       Normal appearance by transvaginal scan
 Left Ovary:    Within normal limits.
 Right Ovary:   Within normal limits. Small corpus luteum noted.
 Adnexa:     No abnormality visualized.
Impression

 No intrauterine gestational sac, yolk sac, fetal pole, or cardiac
 activity noted.  Differential considerations include IUP too
 early to visualize, missed abortion, or ectopic pregnancy.
 Heterogenous prominent endometrium, may be occasionally
 seen in early pregnancy but with the presence of vaginal
 bleeding, also raises the question of retained
 products/debris/blood in the setting of missed
 abortion/abortion in progress. Follow up US recommended in
 10 days.

 questions or concerns.

## 2010-07-05 ENCOUNTER — Ambulatory Visit (HOSPITAL_COMMUNITY)
Admission: RE | Admit: 2010-07-05 | Discharge: 2010-07-05 | Payer: Self-pay | Source: Home / Self Care | Attending: Obstetrics & Gynecology | Admitting: Obstetrics & Gynecology

## 2010-07-07 ENCOUNTER — Inpatient Hospital Stay (HOSPITAL_COMMUNITY)
Admission: AD | Admit: 2010-07-07 | Discharge: 2010-07-07 | Payer: Self-pay | Source: Home / Self Care | Attending: Obstetrics & Gynecology | Admitting: Obstetrics & Gynecology

## 2010-07-14 ENCOUNTER — Inpatient Hospital Stay (HOSPITAL_COMMUNITY)
Admission: AD | Admit: 2010-07-14 | Discharge: 2010-07-14 | Payer: Self-pay | Source: Home / Self Care | Attending: Obstetrics & Gynecology | Admitting: Obstetrics & Gynecology

## 2010-07-21 ENCOUNTER — Inpatient Hospital Stay (HOSPITAL_COMMUNITY)
Admission: AD | Admit: 2010-07-21 | Discharge: 2010-07-21 | Payer: Self-pay | Source: Home / Self Care | Attending: Obstetrics and Gynecology | Admitting: Obstetrics and Gynecology

## 2010-07-28 ENCOUNTER — Inpatient Hospital Stay (HOSPITAL_COMMUNITY)
Admission: AD | Admit: 2010-07-28 | Discharge: 2010-07-28 | Payer: Self-pay | Source: Home / Self Care | Attending: Obstetrics & Gynecology | Admitting: Obstetrics & Gynecology

## 2010-07-28 LAB — HCG, QUANTITATIVE, PREGNANCY: hCG, Beta Chain, Quant, S: 216 m[IU]/mL — ABNORMAL HIGH (ref <5)

## 2010-08-11 ENCOUNTER — Ambulatory Visit: Admit: 2010-08-11 | Payer: Self-pay | Admitting: Obstetrics and Gynecology

## 2010-08-16 ENCOUNTER — Encounter: Payer: Self-pay | Admitting: Gastroenterology

## 2010-10-04 LAB — HCG, QUANTITATIVE, PREGNANCY
hCG, Beta Chain, Quant, S: 241 m[IU]/mL — ABNORMAL HIGH (ref <5)
hCG, Beta Chain, Quant, S: 614 m[IU]/mL — ABNORMAL HIGH (ref <5)

## 2010-10-05 LAB — HCG, QUANTITATIVE, PREGNANCY
hCG, Beta Chain, Quant, S: 2008 m[IU]/mL — ABNORMAL HIGH (ref <5)
hCG, Beta Chain, Quant, S: 3044 m[IU]/mL — ABNORMAL HIGH (ref <5)
hCG, Beta Chain, Quant, S: 3499 m[IU]/mL — ABNORMAL HIGH (ref <5)

## 2010-10-05 LAB — ABO/RH: ABO/RH(D): A POS

## 2010-12-10 NOTE — Discharge Summary (Signed)
[facility]  Patient:    Abigail Porter, Abigail Porter                          MRN: [id number] Adm. Date:  [id number] Disc. Date: [id number] Attending:  Tonye Royalty Dictator:   Antony Contras, [facility]                           Discharge Summary  DISCHARGE DIAGNOSES:          1. Intrauterine pregnancy at 39-1/2 weeks.                               2. History of gestational diabetes.                               3. Spontaneous onset of labor.  PROCEDURES:                   Vacuum-assisted vaginal delivery with shoulder dystocia, McRoberts procedure, suprapubic pressure, midline episiotomy with third-degree extension.  HISTORY OF PRESENT ILLNESS:   The patient is a 35 year old gravida 2, para 0-0-1-0, with LMP May 21, 1999, corrected [facility] of March 24, 2000. Pregnancy was complicated by gestational diabetes which was diet controlled.  PRENATAL LABORATORY:          Blood type is A positive, antibody screen negative.  RPR, HBsAg, and HIV nonreactive.  Rubella immune.  MSAFP within normal limits, and GBS negative.  HOSPITAL COURSE/TREATMENT:    The patient was admitted March 19, 2000, with spontaneous onset of contractions and spontaneous rupture of membranes. Cervix was 4 cm, 80%, vertex, at a -1 station.  She did progress to complete dilatation and delivered via vacuum-assisted vaginal delivery over midline episiotomy with third degree extension.  She did have poor pushing effects, and there was also some shoulder dystocia requiring McRoberts maneuver and suprapubic pressure.  She was delivered of an Apgar 8 and 55 female infant weighing 8 pounds 2 ounces.  Postpartum course remained normal.  She remained afebrile, had no difficulty voiding.  Postpartum CBC with hematocrit 31.6, hemoglobin 11, platelets 239.  She was discharge on her second postpartum day in satisfactory condition.  DISPOSITION:                  Follow up in six weeks.  Continue  prenatal vitamins and iron.  Motrin for pain.DD:  04/05/00 TD:  04/06/00 Job: [id number]

## 2017-09-12 ENCOUNTER — Encounter: Payer: Self-pay | Admitting: Family Medicine

## 2017-09-14 ENCOUNTER — Encounter: Payer: Self-pay | Admitting: Family Medicine

## 2017-09-14 ENCOUNTER — Ambulatory Visit (INDEPENDENT_AMBULATORY_CARE_PROVIDER_SITE_OTHER): Payer: 59 | Admitting: Family Medicine

## 2017-09-14 ENCOUNTER — Ambulatory Visit (INDEPENDENT_AMBULATORY_CARE_PROVIDER_SITE_OTHER): Payer: 59

## 2017-09-14 VITALS — BP 129/88 | HR 96 | Wt 149.0 lb

## 2017-09-14 DIAGNOSIS — M722 Plantar fascial fibromatosis: Secondary | ICD-10-CM | POA: Insufficient documentation

## 2017-09-14 DIAGNOSIS — M1712 Unilateral primary osteoarthritis, left knee: Secondary | ICD-10-CM | POA: Diagnosis not present

## 2017-09-14 DIAGNOSIS — M25462 Effusion, left knee: Secondary | ICD-10-CM

## 2017-09-14 DIAGNOSIS — M25562 Pain in left knee: Secondary | ICD-10-CM

## 2017-09-14 IMAGING — DX DG KNEE COMPLETE 4+V*L*
4 series · 4 of 4 positions shown · non-contrast
Comparison: None.

CLINICAL DATA: Acute left knee pain

EXAM:
LEFT KNEE - COMPLETE 4+ VIEW; RIGHT KNEE - 1-2 VIEW

[tunnel]
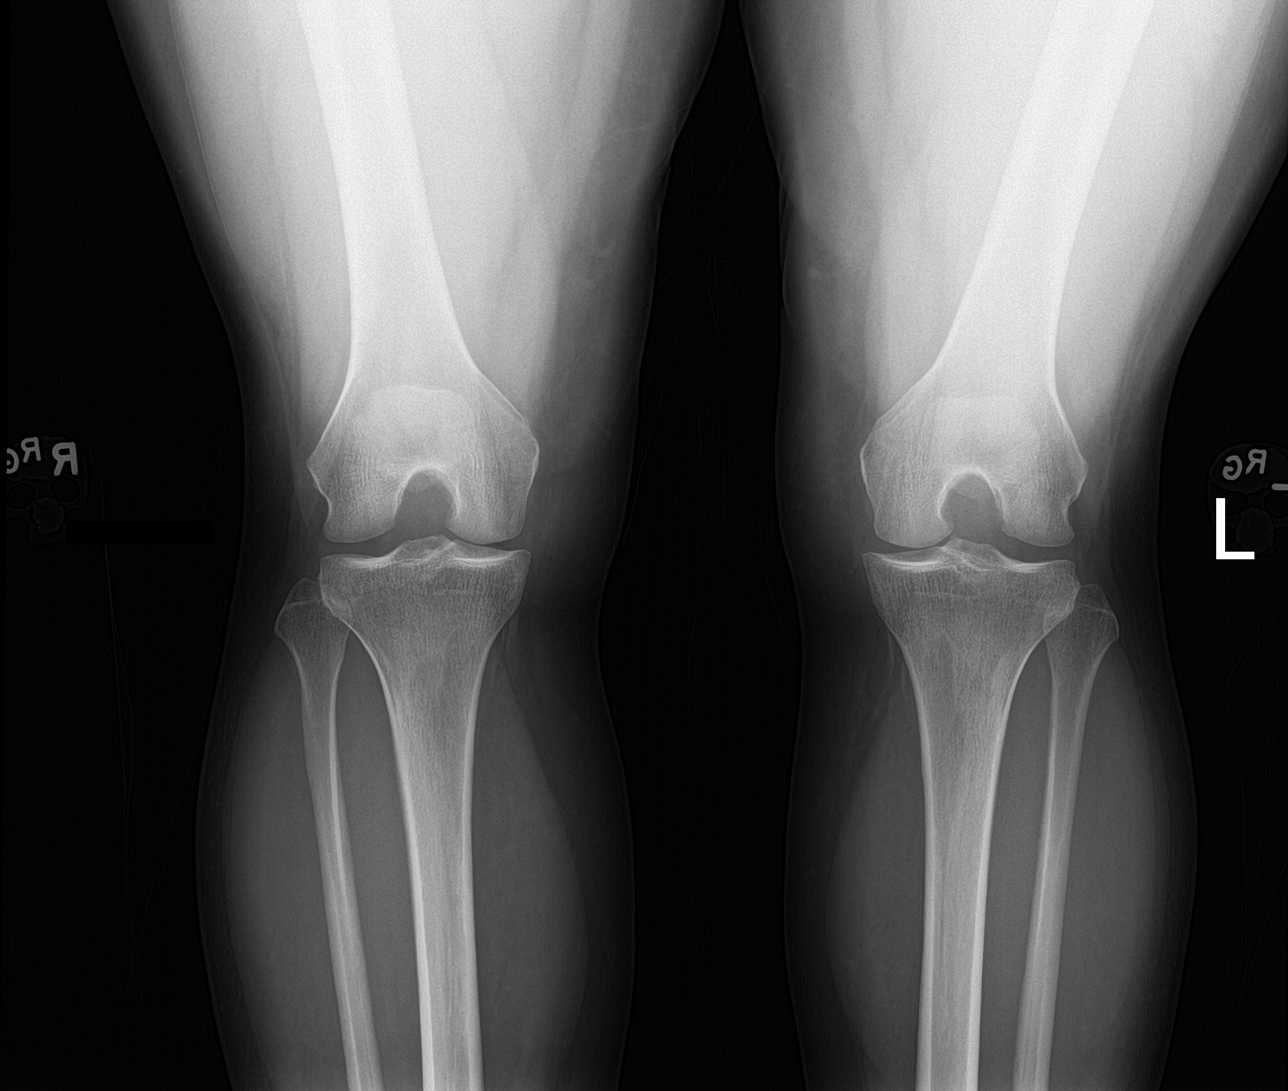

[knee lat]
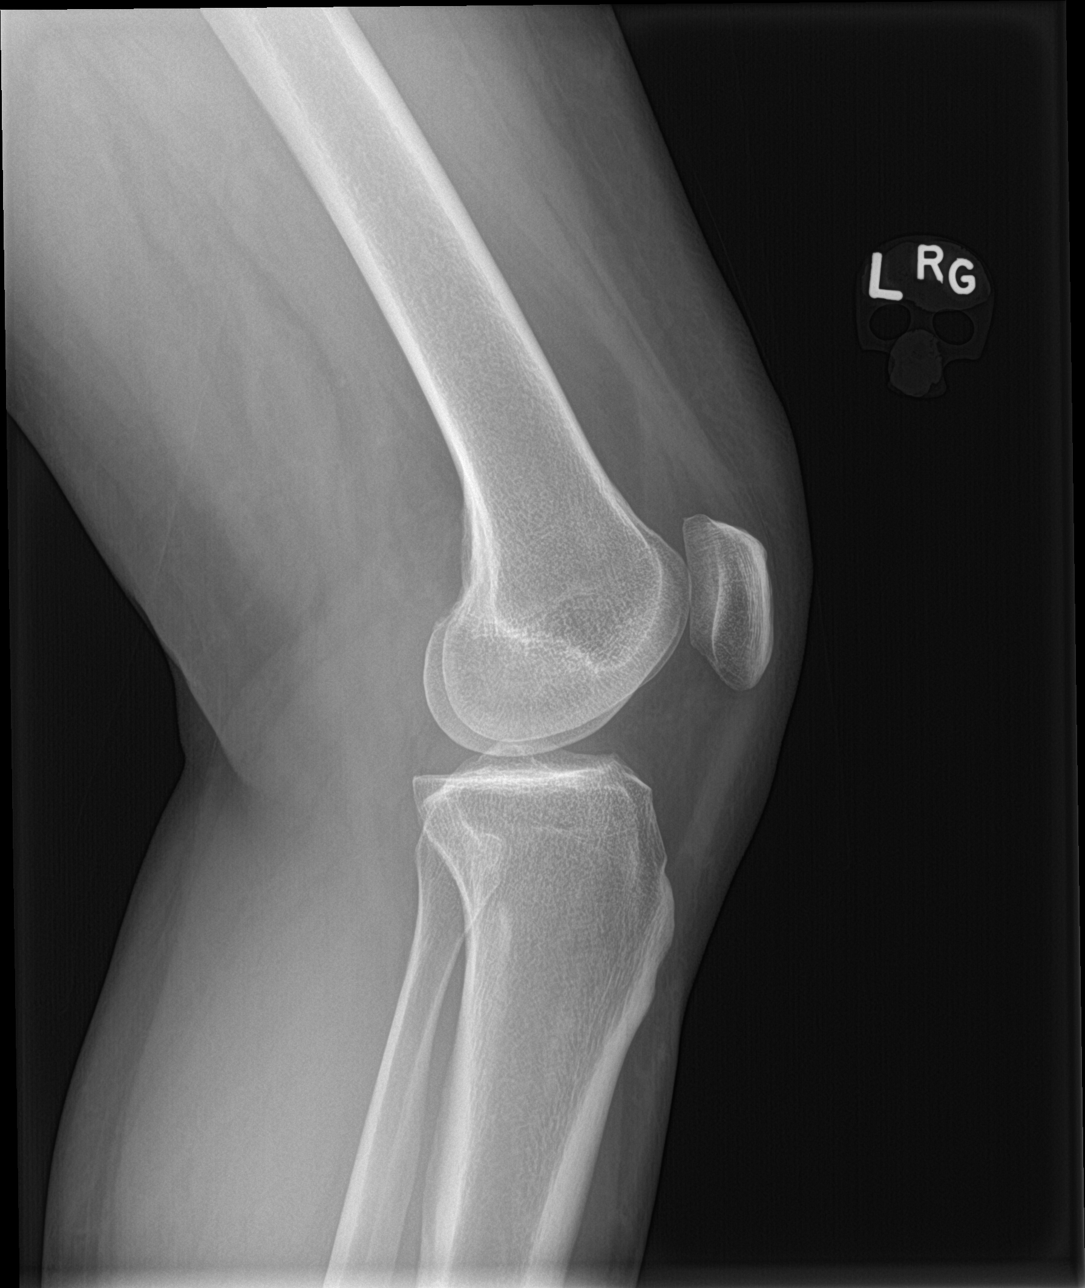

[knee sunrise]
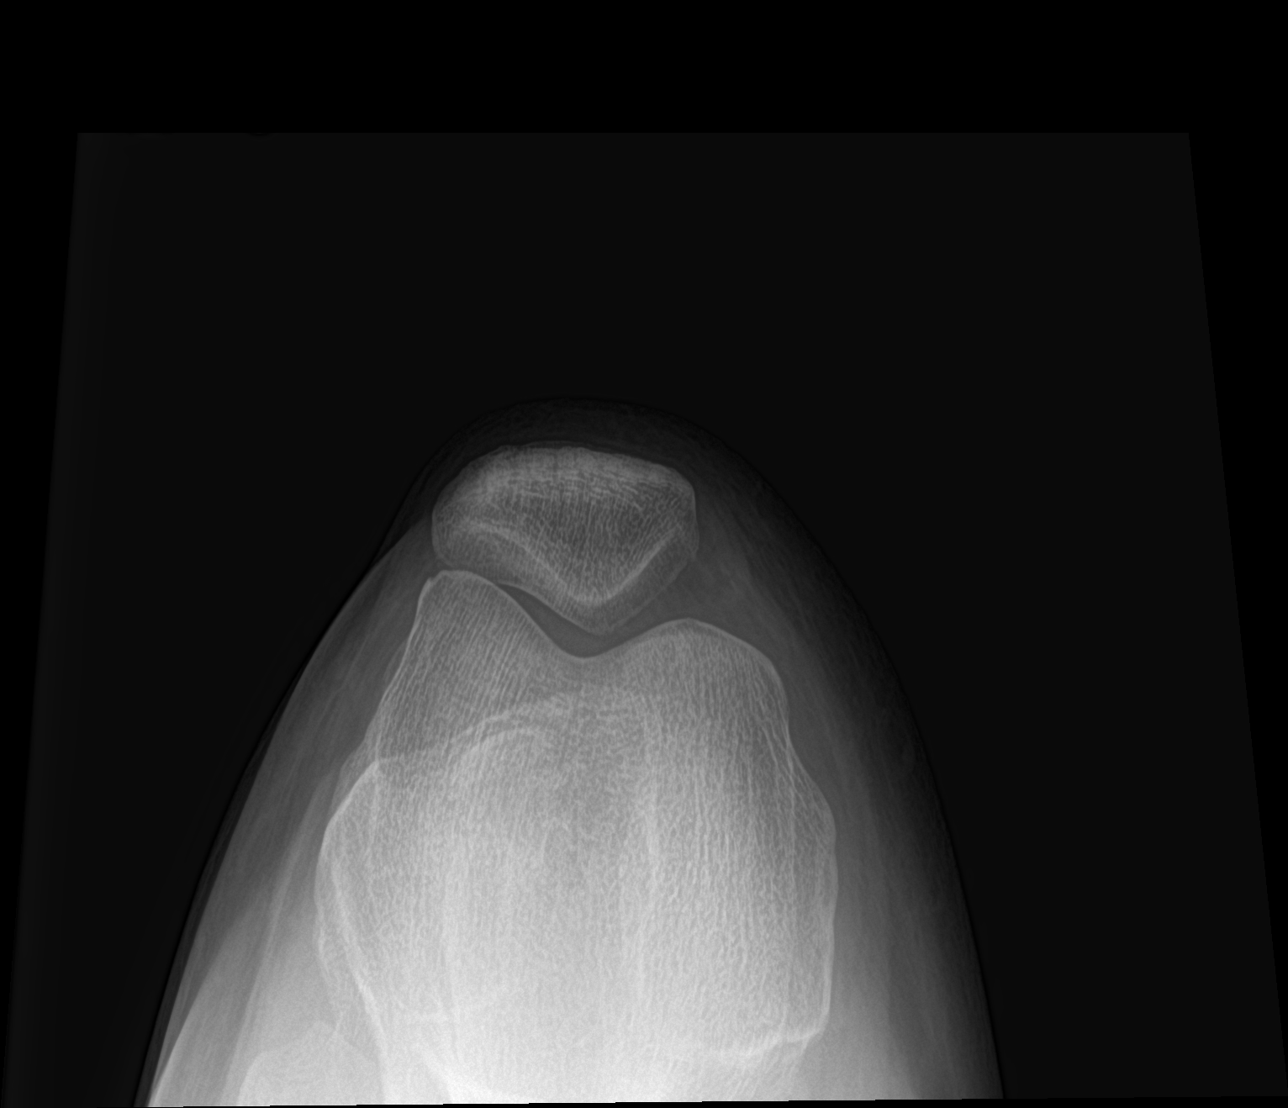

[knee ap bilat standing]
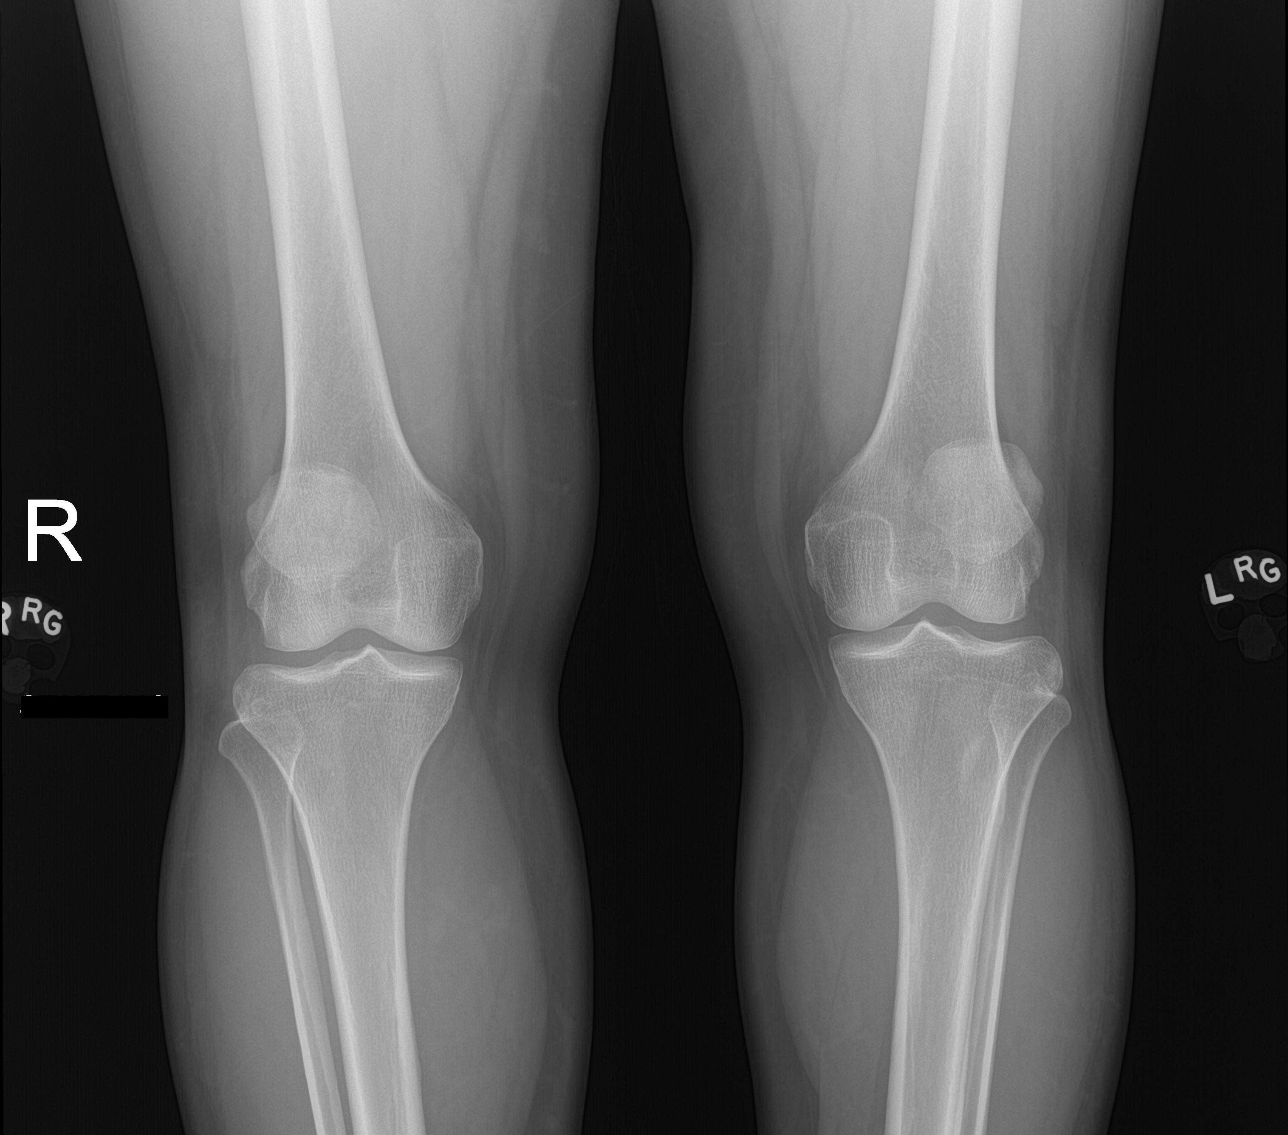

[4 of 4 positions shown; findings below may reference images not displayed]

FINDINGS: Right knee: No fracture or malalignment. Joint spaces are
maintained.

Left knee: No acute fracture or malalignment. Minimal degenerative
change medial compartment. Trace suprapatellar effusion
IMPRESSION: 1. No acute osseous abnormality right knee
2. No acute osseous abnormality left knee. Trace suprapatellar
effusion on the left.

## 2017-09-14 MED ORDER — DICLOFENAC SODIUM 1 % TD GEL: 4.0000 g | Freq: Four times a day (QID) | TRANSDERMAL | 11 refills | Status: DC

## 2017-09-14 MED ORDER — MELOXICAM 15 MG PO TABS: 15.0000 mg | ORAL_TABLET | Freq: Every day | ORAL | 0 refills | Status: DC

## 2017-09-14 NOTE — Progress Notes (Signed)
Subjective:     CC: Left foot and left knee pain  HPI:   Left Foot Pain: Abigail Porter has pain in the left planter calcaneus present for months. The pain occurred without injury and is worse with standing. She also notes pain in the morning when she gets out of bed. She has had a trial of Voltaren and Celebrex PO which has not helped much. She has not had this problem before. She has not tried any exercises or insoles yet.   Left Knee Pain: Abigail Porter notes pain in the left medial knee ongoing for months as well. She denies any injury. She notes that the pain is worse with exercise and standing as well as climbing stairs. She notes grinding and clicking but denies any locking or catching. She has also tried the above oral NSAIDs which have not helped very much. She has not had any xrays for this issues. She denies any injury. She feels well otherwise.   Past medical history, Surgical history, Family history not pertinant except as noted below, Social history, Allergies, and medications have been entered into the medical record, reviewed, and no changes needed.   Review of Systems: No headache, visual changes, nausea, vomiting, diarrhea, constipation, dizziness, abdominal pain, skin rash, fevers, chills, night sweats, weight loss, swollen lymph nodes, body aches, joint swelling, muscle aches, chest pain, shortness of breath, mood changes, visual or auditory hallucinations.   Objective:    Vitals:   09/14/17 1628  BP: 129/88  Pulse: 96   General: Well Developed, well nourished, and in no acute distress.  Neuro/Psych: Alert and oriented x3, extra-ocular muscles intact, able to move all 4 extremities, sensation grossly intact. Skin: Warm and dry, no rashes noted.  Respiratory: Not using accessory muscles, speaking in full sentences, trachea midline.  Cardiovascular: Pulses palpable, no extremity edema. Abdomen: Does not appear distended. MSK:  Left Knee:  Normal appearing with no effusion or  erythremia.  ROM 0-120 TTP medial joint line with palpable squeak but no TTP overlying the patella tendon.  Stable ligament exam.  Negative Mcmurray test.  Intact flexion and extension strength.  Normal Gait.   Left Foot:  Normal Appearing TTP plantar medial calcaneus.  Normal foot motion and strength.  Intact pulses and cap refill  Xray knee BL 09/14/17 Images independently reviewed by me show minimal degenerative changes in the medial compartment.  No acute bony findings.  Awaiting formal radiology review.   Procedure: Real-time Ultrasound Guided Injection of left plantar fascia.   Device: GE Logiq E  Images permanently stored and available for review in the ultrasound unit. Verbal informed consent obtained. Discussed risks and benefits of procedure. Warned about infection bleeding damage to structures skin hypopigmentation and fat atrophy among others. Patient expresses understanding and agreement Time-out conducted.  Noted no overlying erythema, induration, or other signs of local infection.  Skin prepped in a sterile fashion.  Local anesthesia: Topical Ethyl chloride.  With sterile technique and under real time ultrasound guidance: 40mg  kenalog and 1ml marcaine injected easily.  Completed without difficulty  Pain partially resolved suggesting accurate placement of the medication.  Advised to call if fevers/chills, erythema, induration, drainage, or persistent bleeding.  Images permanently stored and available for review in the ultrasound unit.  Impression: Technically successful ultrasound guided injection.    Impression and Recommendations:    Assessment and Plan: 42 y.o. female with  Left foot pain is plantar fasciitis. Plan for injection as above. Will additionally work on ice massage and eccentric  heel exercises.   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 She has crepitation on knee extension but not much DJD a the patellofemoral joint on xray. Some of her  pain could be patellofemoral pain syndrome or patellofemoral chondromalacia.  Plan for a trial of voltaren gel and oral meloxicam.   Recheck both issues in 1 month.    Orders Placed This Encounter  Procedures  . DG Knee Complete 4 Views Left    Please include patellar sunrise, lateral, and weightbearing bilateral AP and bilateral rosenberg views    Standing Status:   Future    Number of Occurrences:   1    Standing Expiration Date:   11/14/2018    Order Specific Question:   Reason for exam:    Answer:   Please include patellar sunrise, lateral, and weightbearing bilateral AP and bilateral rosenberg views    Comments:   Please include patellar sunrise, lateral, and weightbearing bilateral AP and bilateral rosenberg views    Order Specific Question:   Preferred imaging location?    [facility]  . DG Knee 1-2 Views Right    Standing Status:   Future    Number of Occurrences:   1    Standing Expiration Date:   11/15/2018    Order Specific Question:   Reason for Exam (SYMPTOM  OR DIAGNOSIS REQUIRED)    Answer:   For use with the left knee x-ray bilateral AP and Rosenberg standing.    Order Specific Question:   Is the patient pregnant?    Answer:   No    Order Specific Question:   Preferred imaging location?    Answer[facility]   [facility]   Meds ordered this encounter  Medications  . diclofenac sodium (VOLTAREN) 1 % GEL    Sig: Apply 4 g topically 4 (four) times daily. To affected joint.    Dispense:  100 g    Refill:  11  . meloxicam (MOBIC) 15 MG tablet    Sig: Take 1 tablet (15 mg total) by mouth daily.    Dispense:  14 tablet    Refill:  0    Discussed warning signs or symptoms. Please see discharge instructions. Patient expresses understanding.

## 2017-09-14 NOTE — Patient Instructions (Signed)
Thank you for coming in today. Call or go to the ER if you develop a large red swollen joint with extreme pain or oozing puss.   DO the foot stretches 30 reps 2x daily.  Do ice massage to the foot.   Apply the cream to the knee 4x daily  Recheck in 1 month.    Plantar Fasciitis Plantar fasciitis is a painful foot condition that affects the heel. It occurs when the band of tissue that connects the toes to the heel bone (plantar fascia) becomes irritated. This can happen after exercising too much or doing other repetitive activities (overuse injury). The pain from plantar fasciitis can range from mild irritation to severe pain that makes it difficult for you to walk or move. The pain is usually worse in the morning or after you have been sitting or lying down for a while. What are the causes? This condition may be caused by:  Standing for long periods of time.  Wearing shoes that do not fit.  Doing high-impact activities, including running, aerobics, and ballet.  Being overweight.  Having an abnormal way of walking (gait).  Having tight calf muscles.  Having high arches in your feet.  Starting a new athletic activity.  What are the signs or symptoms? The main symptom of this condition is heel pain. Other symptoms include:  Pain that gets worse after activity or exercise.  Pain that is worse in the morning or after resting.  Pain that goes away after you walk for a few minutes.  How is this diagnosed? This condition may be diagnosed based on your signs and symptoms. Your health care provider will also do a physical exam to check for:  A tender area on the bottom of your foot.  A high arch in your foot.  Pain when you move your foot.  Difficulty moving your foot.  You may also need to have imaging studies to confirm the diagnosis. These can include:  X-rays.  Ultrasound.  MRI.  How is this treated? Treatment for plantar fasciitis depends on the severity of the  condition. Your treatment may include:  Rest, ice, and over-the-counter pain medicines to manage your pain.  Exercises to stretch your calves and your plantar fascia.  A splint that holds your foot in a stretched, upward position while you sleep (night splint).  Physical therapy to relieve symptoms and prevent problems in the future.  Cortisone injections to relieve severe pain.  Extracorporeal shock wave therapy (ESWT) to stimulate damaged plantar fascia with electrical impulses. It is often used as a last resort before surgery.  Surgery, if other treatments have not worked after 12 months.  Follow these instructions at home:  Take medicines only as directed by your health care provider.  Avoid activities that cause pain.  Roll the bottom of your foot over a bag of ice or a bottle of cold water. Do this for 20 minutes, 3-4 times a day.  Perform simple stretches as directed by your health care provider.  Try wearing athletic shoes with air-sole or gel-sole cushions or soft shoe inserts.  Wear a night splint while sleeping, if directed by your health care provider.  Keep all follow-up appointments with your health care provider. How is this prevented?  Do not perform exercises or activities that cause heel pain.  Consider finding low-impact activities if you continue to have problems.  Lose weight if you need to. The best way to prevent plantar fasciitis is to avoid the activities  that aggravate your plantar fascia. Contact a health care provider if:  Your symptoms do not go away after treatment with home care measures.  Your pain gets worse.  Your pain affects your ability to move or do your daily activities. This information is not intended to replace advice given to you by your health care provider. Make sure you discuss any questions you have with your health care provider. Document Released: 04/05/2001 Document Revised: 12/14/2015 Document Reviewed:  05/21/2014 Elsevier Interactive Patient Education  [employer]2018 [employer]

## 2017-09-21 ENCOUNTER — Other Ambulatory Visit: Payer: Self-pay

## 2017-09-21 MED ORDER — MELOXICAM 15 MG PO TABS: 15.0000 mg | ORAL_TABLET | Freq: Every day | ORAL | 0 refills | Status: DC

## 2017-10-12 ENCOUNTER — Ambulatory Visit (INDEPENDENT_AMBULATORY_CARE_PROVIDER_SITE_OTHER): Payer: 59 | Admitting: Family Medicine

## 2017-10-12 ENCOUNTER — Encounter: Payer: Self-pay | Admitting: Family Medicine

## 2017-10-12 VITALS — BP 118/82 | HR 102 | Wt 144.0 lb

## 2017-10-12 DIAGNOSIS — M1712 Unilateral primary osteoarthritis, left knee: Secondary | ICD-10-CM | POA: Diagnosis not present

## 2017-10-12 DIAGNOSIS — I10 Essential (primary) hypertension: Secondary | ICD-10-CM

## 2017-10-12 DIAGNOSIS — K219 Gastro-esophageal reflux disease without esophagitis: Secondary | ICD-10-CM

## 2017-10-12 DIAGNOSIS — M722 Plantar fascial fibromatosis: Secondary | ICD-10-CM | POA: Diagnosis not present

## 2017-10-12 HISTORY — DX: Gastro-esophageal reflux disease without esophagitis: K21.9

## 2017-10-12 HISTORY — DX: Essential (primary) hypertension: I10

## 2017-10-12 NOTE — Patient Instructions (Signed)
Thank you for coming in today. STOP meloxicam Continue diclofenac.  Follow up with Vinetta Bergamoharley  See me if your pain comes back.

## 2017-10-12 NOTE — Progress Notes (Signed)
Abigail Porter Porter is a 42 y.o. female who presents to [facility] today for follow-up to left knee and left foot pain.  Abigail Porter was seen about a month ago for left knee and left foot pain.  The foot pain was quite bothersome and thought to be plantar fasciitis.  Seated with the plantar fascia injection and she notes that her foot is completely better now.  She feels great.  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  She has been using diclofenac gel and meloxicam which has helped a lot.  She thinks that she is about 95% better.  She continues to take meloxicam most of the days.  She uses omeprazole for GI prophylaxis.  Additionally Abigail Porter needs to establish care and have a wellness exam as part of her biometrics for employment.  She is scheduled appointment with my colleague Gena FrayCharley Cummings PA-C on April 1.  She cannot quite remember exactly the name of her primary care provider but has the card somewhere.  She plans to bring the card back tomorrow so that we can obtain medical records in preparation for her establish care visit.   Past Medical History:  Diagnosis Date  . GERD (gastroesophageal reflux disease) 10/12/2017  . HTN (hypertension) 10/12/2017   History reviewed. No pertinent surgical history. Social History   Tobacco Use  . Smoking status: Never Smoker  . Smokeless tobacco: Never Used  Substance Use Topics  . Alcohol use: Not on file     ROS:  As above   Medications: Current Outpatient Medications  Medication Sig Dispense Refill  . diclofenac sodium (VOLTAREN) 1 % GEL Apply 4 g topically 4 (four) times daily. To affected joint. 100 g 11  . lisinopril-hydrochlorothiazide (PRINZIDE,ZESTORETIC) 10-12.5 MG tablet Take 1 tablet by mouth daily.  1  . meloxicam (MOBIC) 15 MG tablet Take 1 tablet (15 mg total) by mouth daily. 30 tablet 0  . omeprazole (PRILOSEC) 20 MG capsule TAKE 1 CAPSULE BY MOUTH EVERY DAY IN THE  MORNING, 30 MIN TO 1 HR PRIOR TO BREAKFAST  5   No current facility-administered medications for this visit.    No Known Allergies   Exam:  BP 118/82   Pulse (!) 102   Wt 144 lb (65.3 kg)   LMP 09/13/2017  General: Well Developed, well nourished, and in no acute distress.  Neuro/Psych: Alert and oriented x3, extra-ocular muscles intact, able to move all 4 extremities, sensation grossly intact. Skin: Warm and dry, no rashes noted.  Respiratory: Not using accessory muscles, speaking in full sentences, trachea midline.  Cardiovascular: Pulses palpable, no extremity edema. Abdomen: Does not appear distended. MSK:  Left knee normal-appearing nontender normal motion Foot nontender normal gait.    No results found for this or any previous visit (from the past 48 hour(s)). No results found.    Assessment and Plan: 42 y.o. female with  Significantly improved.  Plan to start weaning off of meloxicam.  Tylenol will be safer for both blood pressure and GI standpoint. She needs diclofenac as needed.  Return if not better.  Next step may be a steroid injection.  Left foot plantar fasciitis: Significantly improved.  Plan to continue exercise program.  Recheck if symptoms return.  Hypertension/GERD: This issue will be further addressed with PCP.  Will obtain medical records tomorrow with the name of the physician.  Abstract when we get back information.   No orders of the defined types were placed  in this encounter.  No orders of the defined types were placed in this encounter.   Discussed warning signs or symptoms. Please see discharge instructions. Patient expresses understanding.

## 2017-10-19 ENCOUNTER — Ambulatory Visit: Payer: 59 | Admitting: Physician Assistant

## 2017-10-23 ENCOUNTER — Ambulatory Visit: Payer: 59 | Admitting: Physician Assistant

## 2017-10-25 ENCOUNTER — Other Ambulatory Visit: Payer: Self-pay | Admitting: Family Medicine

## 2017-11-16 ENCOUNTER — Encounter: Payer: Self-pay | Admitting: Physician Assistant

## 2017-11-16 ENCOUNTER — Ambulatory Visit (INDEPENDENT_AMBULATORY_CARE_PROVIDER_SITE_OTHER): Payer: 59 | Admitting: Physician Assistant

## 2017-11-16 VITALS — BP 127/88 | HR 83 | Resp 16 | Ht 61.0 in | Wt 142.8 lb

## 2017-11-16 DIAGNOSIS — K219 Gastro-esophageal reflux disease without esophagitis: Secondary | ICD-10-CM | POA: Diagnosis not present

## 2017-11-16 DIAGNOSIS — I10 Essential (primary) hypertension: Secondary | ICD-10-CM | POA: Diagnosis not present

## 2017-11-16 DIAGNOSIS — Z1231 Encounter for screening mammogram for malignant neoplasm of breast: Secondary | ICD-10-CM

## 2017-11-16 DIAGNOSIS — Z13220 Encounter for screening for lipoid disorders: Secondary | ICD-10-CM

## 2017-11-16 DIAGNOSIS — Z7689 Persons encountering health services in other specified circumstances: Secondary | ICD-10-CM | POA: Diagnosis not present

## 2017-11-16 DIAGNOSIS — Z130 Encounter for screening for diseases of the blood and blood-forming organs and certain disorders involving the immune mechanism: Secondary | ICD-10-CM | POA: Diagnosis not present

## 2017-11-16 DIAGNOSIS — Z6827 Body mass index (BMI) 27.0-27.9, adult: Secondary | ICD-10-CM | POA: Diagnosis not present

## 2017-11-16 DIAGNOSIS — Z23 Encounter for immunization: Secondary | ICD-10-CM

## 2017-11-16 DIAGNOSIS — Z131 Encounter for screening for diabetes mellitus: Secondary | ICD-10-CM

## 2017-11-16 DIAGNOSIS — Z975 Presence of (intrauterine) contraceptive device: Secondary | ICD-10-CM | POA: Insufficient documentation

## 2017-11-16 LAB — COMPREHENSIVE METABOLIC PANEL
AG RATIO: 1.4 (calc) (ref 1.0–2.5)
ALBUMIN MSPROF: 4.3 g/dL (ref 3.6–5.1)
ALT: 17 U/L (ref 6–29)
AST: 15 U/L (ref 10–30)
Alkaline phosphatase (APISO): 43 U/L (ref 33–115)
BUN: 18 mg/dL (ref 7–25)
CHLORIDE: 102 mmol/L (ref 98–110)
CO2: 30 mmol/L (ref 20–32)
Calcium: 9.5 mg/dL (ref 8.6–10.2)
Creat: 0.74 mg/dL (ref 0.50–1.10)
GLOBULIN: 3 g/dL (ref 1.9–3.7)
Glucose, Bld: 94 mg/dL (ref 65–99)
POTASSIUM: 4.4 mmol/L (ref 3.5–5.3)
SODIUM: 138 mmol/L (ref 135–146)
TOTAL PROTEIN: 7.3 g/dL (ref 6.1–8.1)
Total Bilirubin: 0.6 mg/dL (ref 0.2–1.2)

## 2017-11-16 LAB — CBC
HCT: 42.4 % (ref 35.0–45.0)
HEMOGLOBIN: 14.2 g/dL (ref 11.7–15.5)
MCH: 29.2 pg (ref 27.0–33.0)
MCHC: 33.5 g/dL (ref 32.0–36.0)
MCV: 87.1 fL (ref 80.0–100.0)
MPV: 9.6 fL (ref 7.5–12.5)
Platelets: 344 10*3/uL (ref 140–400)
RBC: 4.87 10*6/uL (ref 3.80–5.10)
RDW: 13.1 % (ref 11.0–15.0)
WBC: 8.5 10*3/uL (ref 3.8–10.8)

## 2017-11-16 LAB — LIPID PANEL W/REFLEX DIRECT LDL
CHOL/HDL RATIO: 3.4 (calc) (ref <5.0)
Cholesterol: 188 mg/dL (ref <200)
HDL: 56 mg/dL (ref >50)
LDL Cholesterol (Calc): 110 mg/dL (calc) — ABNORMAL HIGH
NON-HDL CHOLESTEROL (CALC): 132 mg/dL — AB (ref <130)
Triglycerides: 108 mg/dL (ref <150)

## 2017-11-16 MED ORDER — LISINOPRIL-HYDROCHLOROTHIAZIDE 10-12.5 MG PO TABS: 1.0000 | ORAL_TABLET | Freq: Every day | ORAL | 1 refills | Status: DC

## 2017-11-16 MED ORDER — OMEPRAZOLE 20 MG PO CPDR: DELAYED_RELEASE_CAPSULE | ORAL | 1 refills | Status: DC

## 2017-11-16 NOTE — Progress Notes (Signed)
HPI:                                                                Abigail Porter is a 42 y.o. female who [facility] today to establish care  Vietnamese speaking patient. Declines interpreter services.  Current concerns: biometrics for employer, medication refills  HTN: taking Prinzide daily. Compliant with medications. Does not check BP's at home.  Denies vision change, headache, chest pain with exertion, orthopnea, lightheadedness, syncope and edema.   GERD: reports she takes Omeprazole for "pain in stomach when I'm hungry." Denies weight loss, nausea, hematemesis, hematochezia or melena.  [facility] 11/16/2017  Decreased Interest 0  Down, Depressed, Hopeless 0  PHQ - 2 Score 0  Altered sleeping 0  Tired, decreased energy 0  Change in appetite 0  Feeling bad or failure about yourself  0  Trouble concentrating 0  Moving slowly or fidgety/restless 0  Suicidal thoughts 0  PHQ-9 Score 0  Difficult doing work/chores Not difficult at all    Past Medical History:  Diagnosis Date  . GERD (gastroesophageal reflux disease) 10/12/2017  . HTN (hypertension) 10/12/2017   Past Surgical History:  Procedure Laterality Date  . INTRAUTERINE DEVICE (IUD) INSERTION     Social History   Tobacco Use  . Smoking status: Never Smoker  . Smokeless tobacco: Never Used  Substance Use Topics  . Alcohol use: Never    Frequency: Never   family history includes Hypertension in her mother.    ROS: Review of Systems  Musculoskeletal: Positive for joint pain and neck pain.  All other systems reviewed and are negative.    Medications: Current Outpatient Medications  Medication Sig Dispense Refill  . diclofenac sodium (VOLTAREN) 1 % GEL Apply 4 g topically 4 (four) times daily. To affected joint. 100 g 11  . lisinopril-hydrochlorothiazide (PRINZIDE,ZESTORETIC) 10-12.5 MG tablet Take 1 tablet by mouth daily.  1  .  meloxicam (MOBIC) 15 MG tablet TAKE 1 TABLET BY MOUTH EVERY DAY 30 tablet 0  . omeprazole (PRILOSEC) 20 MG capsule TAKE 1 CAPSULE BY MOUTH EVERY DAY IN THE MORNING, 30 MIN TO 1 HR PRIOR TO BREAKFAST  5   No current facility-administered medications for this visit.    No Known Allergies     Objective:  BP 127/88   Pulse 83   Resp 16   Ht 5' (1.524 m)   Wt 142 lb 12 oz (64.8 kg)   LMP 11/13/2017   BMI 27.88 kg/m  Gen:  alert, not ill-appearing, no distress, appropriate for age HEENT: head normocephalic without obvious abnormality, conjunctiva and cornea clear, trachea midline Pulm: Normal work of breathing, normal phonation, clear to auscultation bilaterally, no wheezes, rales or rhonchi CV: Normal rate, regular rhythm, s1 and s2 distinct, no murmurs, clicks or rubs  Neuro: alert and oriented x 3, no tremor MSK: extremities atraumatic, normal gait and station Skin: intact, no rashes on exposed skin, no jaundice, no cyanosis Psych: well-groomed, cooperative, good eye contact, euthymic mood, affect mood-congruent, speech is articulate, and thought processes clear and goal-directed    No results found for this or any previous visit (from the past 72 hour(s)). No results found.    Assessment and Plan:  42 y.o. female with   1. Encounter to establish care - Personally reviewed PMH, PSH, PFH, medications, allergies, HM - Age-appropriate cancer screening: due for mammogram, patient does not know her Pap smear history (requesting records from multiple practices in [facility]) - Influenza n/a - Tdap due - PHQ2 negative   2. Essential hypertension BP Readings from Last 3 Encounters:  11/16/17 127/88  10/12/17 118/82  09/14/17 129/88  - fasting labs today - counseled on therapeutic lifestyle changes - continue Prinzide 10-12.5   Encounter to establish care  Essential hypertension - Plan: lisinopril-hydrochlorothiazide (PRINZIDE,ZESTORETIC) 10-12.5 MG  tablet  Gastroesophageal reflux disease without esophagitis - Plan: omeprazole (PRILOSEC) 20 MG capsule  BMI 27.0-27.9,adult  Breast cancer screening by mammogram - Plan: MM 3D SCREEN BREAST BILATERAL  Screening for blood disease - Plan: CBC, Comprehensive metabolic panel  Screening for diabetes mellitus - Plan: Comprehensive metabolic panel  Screening for lipid disorders - Plan: Lipid Panel w/reflex Direct LDL  Need for Tdap vaccination - Plan: Tdap vaccine greater than or equal to 7yo IM   Patient education and anticipatory guidance given Patient agrees with treatment plan Follow-up in 6 months for medication managemet or sooner as needed if symptoms worsen or fail to improve  Levonne Hubert PA-C

## 2017-11-16 NOTE — Patient Instructions (Signed)
L?a Ch?n Th?c ?n cho B?nh Tro Ng??c D? Dy Th?c Qu?n, Ng??i L?n Food Choices for Gastroesophageal Reflux Disease, Adult Khi qu v? b? b?nh tro ng??c d? dy th?c qu?n (GERD), th?c ph?m qu v? ?n v thi quen ?n u?ng c?a qu v? l r?t quan tr?ng. L?a ch?n th?c ph?m ?ng c th? gip lm gi?m s? kh ch?u c?a b?nh tro ng??c d? dy th?c qu?n (GERD). Cn nh?c lm vi?c v?i m?t chuyn gia v? ch? ?? ?n v dinh d??ng (chuyn gia dinh d??ng) nh?m gip qu v? l?a ch?n nh?ng th?c ph?m t?t cho s?c kh?e. Nh?ng h??ng d?n chung no ti ph?i tun theo? K? ho?ch ?n u?ng  Ch?n nh?ng th?c ph?m t?t cho s?c kh?e t ch?t bo, ch?ng h?n nh? tri cy, rau c?, ng? c?c nguyn h?t, cc s?n ph?m t? s?a t bo, v th?t n?c, c, v th?t gia c?m.  ?n cc b?a nh?, th??ng xuyn thay v ba b?a l?n m?i ngy. ?n ch?m ri, trong khng gian th? gin. Trnh g?p ng??i ho?c n?m xu?ng cho ??n khi ?n xong ???c 2-3 gi?.  H?n ch? cc th?c ph?m giu ch?t bo ch?ng h?n nh? th?t nhi?u m? ho?c th?c ph?m chin.  Gi?i h?n l??ng tiu th? d?u, b?, v ch?t bo t? d?u th?c v?t ? m?c d??i 8 mu?ng c ph m?i ngy.  Tra?nh nh??ng th?? sau: ? Cc th?c ph?m gy ra cc tri?u ch?ng. Nh?ng tri?u ch?ng ny c th? khc nhau ??i v?i nh?ng ng??i khc nhau. Hy ghi nh?t k th?c ph?m ?? theo di nh?ng th?c ph?m no gy ra cc tri?u ch?ng. ? R??u. ? U?ng nhi?u ch?t l?ng khi ?n. ? ?n trong kho?ng 2-3 gi? tr??c khi ?i ng?.  N?u ?n b?ng cc ph??ng php khc thay vi? chin. Ph??ng php ny c th? bao g?m b? l, n??ng, ho?c hun nng. L?i s?ng   Duy tr m??c cn n?ng c l?i cho s?c kh?e. H?i chuyn gia ch?m [place] s?c kh?e c?a quy? vi? xem m??c cn n??ng na?o la? t?t cho quy? vi?. N?u qu v? c?n gi?m cn, hy trao ??i v?i chuyn gia ch?m [place] s?c kh?e c?a qu v? ?? gi?m cn m?t cch an ton.  T?p th? d?c t?i thi?u 30 pht t? 5 ngy tr? ln m?i tu?n, ho?c theo ch? d?n c?a chuyn gia ch?m [place] s?c kh?e.  Trnh m?c qu?n o b ch?t quanh th?t l?ng v ng?c.  Khng s?  d?ng b?t k? s?n ph?m no ch?a nicotine ho?c thu?c l, ch?ng ha?n nh? thu?c l d?ng ht v thu?c l ?i?n t?. N?u qu v? c?n gip ?? ?? cai thu?c, hy h?i chuyn gia ch?m  s?c kh?e.  Ng? v?i ??u gi??ng k cao. S? d?ng chm d??i n?m ho?c t?m k d??i khung gi??ng ?? nng ??u gi??ng ln. Nh?ng lo?i th?c ?n no khng ???c khuyn dng? Nh?ng m?c ???c li?t k c th? khng ph?i danh sch ??y ??. Hy trao ??i v?i bc s? chuyn khoa dinh d??ng xem cc l?a ch?n ch? ?? dinh d??ng no ph h?p nh?t v?i qu v?. Ng? c?c Bnh ng?t ho?c bnh m n??ng nhanh b? sung ch?t bo. Bnh m n??ng c?a Php. Rau c? Marlou Starks c? chin ng?p d?u. Khoai ty chin. M?i lo?i rau c? ???c ch? bi?n km thm ch?t bo. M?i lo?i rau c? gy ra cc tri?u ch?ng. ??i v?i m?t s? ng??i, nh?ng lo?i rau c? ny c th? bao g?m c chua v  cc s?n ph?m t? c chua, ?t, hnh v t?i, v c?i ng?a. Tri cy M?i lo?i tri cy ???c ch? bi?n km thm ch?t bo. M?i lo?i tri cy c th? gy ra cc tri?u ch?ng. ??i v?i m?t s? ng??i, nh?ng lo?i tri cy ny c th? bao g?m tri cy h? cam qut, ch?ng h?n nh? cam, b??i, qu? d?a, v chanh. Th?t v cc th?c ph?m ch?a protein khc Th?t giu ch?t bo, ch?ng h?n nh? th?t l?n ho?c th?t b nhi?u m?, xc xch, s??n, gi?m bng, l?p x??ng, [place] v th?t l?n mu?i xng khi. Th?t chin ho?c protein, bao g?m c chin v th?t g chin. Qu? h?ch v b? h?t. S?a. S?a nguyn kem v s?a s-c-la. Kem chuaEvans Lance. Kem. Kem. Pho mt kem. S?a l?c. ?? u?ng C ph v tr, c ho?c khng c caffeine. ?? u?ng c ga. Soda. ?? u?ng t?ng l?c. N??c p tri cy lm t? tri cy chua (ch?ng h?n nh? cam ho?c b??i). N??c p c chua. ?? u?ng ch?a c?n. M? v d?u B?. B? th?c v?t. Ch?t bo t? d?u th?c v?t. B? s??a tru. K?o v ?? trng mi?ng S-c-la v c ca. Bnh rnMilagros Loll. Gia v? v cc th?c ph?m khc. H?t tiu. B?c h v b?c h l?c. M?i lo?i gia v?, th?o d??c, ho?c gia v? gy ra cc tri?u ch?ng. ??i v?i m?t s? ng??i, gia v? v cc th?c ph?m khc c th? bao g?m  c ri, n??c s?t nng, ho?c d?u d?m ?? tr?n salad. Tm t?t  Khi qu v? b? b?nh tro ng??c d? dy th?c qu?n (GERD), cc l?a ch?n th?c ph?m v l?i s?ng c vai tr r?t quan tr?ng ?? gip lm gi?m s? kh ch?u c?a GERD.  ?n cc b?a nh?, th??ng xuyn thay v ba b?a l?n m?i ngy. ?n ch?m ri, trong khng gian th? gin. Trnh g?p ng??i ho?c n?m xu?ng cho ??n khi ?n xong ???c 2-3 gi?.  H?n ch? cc th?c ph?m giu ch?t bo, ch?ng h?n nh? th?t m? ho?c th?c ph?m chin. Thng tin ny khng nh?m m?c ?ch thay th? cho l?i khuyn m chuyn gia ch?m [place] s?c kh?e ni v?i qu v?. Hy b?o ??m qu v? ph?i th?o lu?n b?t k? v?n ?? g m qu v? c v?i chuyn gia ch?m [place] s?c kh?e c?a qu v?. Document Released: 10/28/2016 Document Revised: 10/28/2016 Elsevier Interactive Patient Education  2018 Elsevier Inc.   Qu?n l t?ng huy?t p Managing Your Hypertension T?ng huy?t p th??ng ???c g?i l huy?t p cao. T?ng huy?t p l khi l?c c?a mu p ln thnh ??ng m?ch qu l?n. Cc ??ng m?ch l cc m?ch mu mang mu t? tim ?i kh?p c? th? c?a qu v?. T?ng huy?t p khi?n tim lm vi?c v?t v? h?n ?? b?m mu, v c th? khi?n cc ??ng m?ch tr? ln h?p ho?c c?ng. T?ng huy?t p khng ???c ?i?u tr? ho?c ki?m sot c th? d?n t?i nh?i mu c? tim, ??t qu?, b?nh th?n v nh?ng v?n ?? khc. C nh?ng ch? s? huy?t p no? Ch? s? ?o huy?t p g?m m?t ch? s? cao trn m?t ch? s? th?p. Huy?t a?p ly? t???ng cu?a quy? vi? la? d??i 120/80. Ch? s? ??u tin ("??nh") ???c g?i l huy?t p tm thu. ?y l s? ?o p su?t trong ??ng m?ch khi tim qu v? ??p. Ch? s? th? hai ("?y") ???c g?i l huy?t p tm tr??ng. ?y l s? ?o p su?t trong ??ng  m?ch khi tim qu v? ngh?Nicki Reaper? s? huy?t p c?a ti c ngh?a l g? Huy?t p ???c phn lo?i thnh b?n giai ?o?n. D?a trn ch? s? huy?t p c?a qu v?, chuyn gia ch?m [place] s?c kh?e c?a qu v? c th? s? d?ng nh?ng giai ?o?n sau ?y ?? xc ??nh ki?u ?i?u tr? qu v? c?n, n?u c. Huy?t p tm thu v huy?t p tm tr??ng ???c ?o theo  ??n v? mm Hg. Bnh th??ng  Huy?t p tm thu: d??i 120.  Huy?t p tm tr??ng: d??i 80. Cao  Huy?t p tm thu: 120-129.  Huy?t p tm tr??ng: d??i 80. T?ng huy?t p giai ?o?n 1  Huy?t p tm thu: 130-139.  Huy?t p tm tr??ng: 80-89. T?ng huy?t p giai ?o?n 2  Huy?t p tm thu: 140 tr? ln.  Huy?t p tm tr??ng: 90 tr? ln. Nh?ng nguy c? s?c kh?e no g?n v?i t?ng huy?t p? Qu?n l t?ng huy?t p c?a qu v? l m?t trch nhi?m quan tr?ng. T?ng huy?t p khng ???c ki?m sot c th? d?n t?i:  Nh?i mu c? tim.  B? ??t qu?Marland Kitchen  M?ch mu b? y?u (ch?ng phnh m?ch).  Suy tim.  T?n th??ng th?n.  T?n th??ng m?t.  H?i ch?ng chuy?n ha.  Cc v?n ?? v? t?p trung v tr nh?.  Ti c th? th?c hi?n nh?ng thay ??i no ?? qu?n l t?ng huy?t p c?a mnh? C th? qu?n l t?ng huy?t p b?ng cch thay ??i l?i s?ng v c th? l dng thu?c. Chuyn gia ch?m [place] s?c kh?e c?a qu v? s? gip qu v? ln k? ho?ch ??a huy?t p v? gi?i h?n bnh th??ng. ?n v u?ng  ?n ch? ?? giu ch?t x? v kali v t mu?i (natri), ???ng ph? gia v ch?t bo. M?t k? ho?ch ?n m?u c tn ch? ?? ?n DASH (Cch ti?p c?n ?n u?ng ?? gi?m t?ng huy?t p). ?n theo cch ny: ? ?n nhi?u tri cy v rau t??i. Vo m?i b?a ?n, c? g?ng dnh m?t n?a ??a cho tri cy v rau. ? ?n ng? c?c nguyn h?t, ch?ng h?n nh? m ?ng lm t? b?t m nguyn cm, ho?c bnh m nguyn h?t. Cho ngu? c?c nguyn ca?m va?o m?t ph?n t? ??a c?a quy? vi?. ? ?n cc s?n ph?m t? s?a t bo. ? Trnh nh?ng mi?ng th?t nhi?u m?, th?t ? qua ch? bi?n ho?c th?t ??p mu?i v th?t gia c?m c da. Dnh kho?ng m?t ph?n t? ??a c?a qu v? cho cc protein khng m?, ch?ng h?n nh? c, th?t g khng da, ??u, tr?ng, v ??u ph?. ? Trnh nh?ng th?c ph?m ch? bi?n ho?c lm s?n. Nh?ng th?c ph?m ny th??ng c nhi?u natri, ???ng ph? gia v ch?t bo h?n.  Gi?m l??ng dng natri hng ngy c?a qu v?. H?u h?t nh?ng ng??i b? t?ng huy?t p ??u nn ?n d??i 1.500 mg natri m?i ngy.  Gi?i h?n l??ng r??u  qu v? u?ng khng qu 1 ly m?i ngy v?i ph? n? khng mang thai v 2 ly m?i ngy v?i nam gi?i. M?t ly t??ng ???ng v?i 12 ao-x? bia, 5 ao-x? r??u vang, ho?c 1 ao-x? r??u m?nh. L?i s?ng  H?p tc v?i chuyn gia ch?m [place] s?c kh?e c?a qu v? ?? duy tr tr?ng l??ng c? th? c l?i cho s?c kh?e, ho?c gi?m cn. Hy h?i xem tr?ng l??ng no l l t??ng cho qu v?.  Dnh t nh?t 30 pht ??  t?p th? d?c m c th? khi?n tim qu v? ??p nhanh h?n (t?p th? d?c nh?p ?i?u) h?u h?t cc ngy trong tu?n. Cc ho?t ??ng c th? bao g?m ?i b?, b?i, ho?c ??p xe.  Bao g?m bi t?p t?ng c??ng c? (bi t?p khng l?c), ch?ng h?n nh? nng t?, nh? m?t ph?n c?a thi quen luy?n t?p hng tu?n c?a qu v?. C? g?ng t?p nh?ng lo?i bi t?p ny trong vng 30 pht t?i thi?u 3 ngy m?t tu?n.  Khng s? d?ng b?t k? s?n ph?m no ch?a nicotine ho?c thu?c l, ch?ng ha?n nh? thu?c l d?ng ht v thu?c l ?i?n t?. N?u qu v? c?n gip ?? ?? cai thu?c, hy h?i chuyn gia ch?m [place] s?c kh?e.  Ki?m sot b?t k? tnh tr?ng ko di (m?n tnh) no m qu v? c, ch?ng h?n nh? cholesterol cao ho?c ti?u ???ng. Theo di  Theo di huy?t p c?a qu v? t?i nh theo h??ng d?n c?a chuyn gia ch?m [place] s?c kh?e. Huy?t p m?c tiu c nhn c?a qu v? c th? khc nhau ty thu?c v tnh tr?ng b?nh l, tu?i v cc nhn t? khc.  Ki?m tra huy?t p ??nh k?, th??ng xuyn theo h??ng d?n c?a chuyn gia ch?m [place] s?c kh?e. Trao ??i v?i chuyn gia ch?m [place] s?c kh?e c?a mnh  Cng v?i chuyn gia ch?m [place] s?c kh?e xem xt l?i m?i lo?i thu?c qu v? dng b?i v c th? c tc d?ng ph? ho?c t??ng tc thu?c.  Trao ??i v?i chuyn gia ch?m [place] s?c kh?e c?a qu v? v? ch? ?? ?n, thi qun luy?n t?p v cc y?u t? l?i s?ng khc m c th? gp ph?n vo t?ng huy?t p.  Khm chuyn gia ch?m [place] s?c kh?e ??nh k?. Chuyn gia ch?m [place] s?c kh?e c?a qu v? c th? gip qu v? t?o ra v ?i?u ch?nh k? ho?ch qu?n l t?ng huy?t p. Ti c c?n dng thu?c ?? ki?m sot huy?t p c?a mnh khng? N?u thay ??i  l?i s?ng khng ?? ?? ??a huy?t p v? m?c c th? ki?m sot ???c, chuyn gia ch?m [place] s?c kh?e c th? k ??n thu?c, v n?u:  Huy?t p tm thu c?a qu v? t? 130 tr? ln.  Huy?t p tm tr??ng c?a qu v? t? 80 tr? ln.  Ch? s? d?ng thu?c theo ch? d?n c?a chuyn gia ch?m [place] s?c kh?e cu?a quy? vi?. Lm theo ch? d?n m?t cch c?n th?n. Thu?c ?i?u tr? huy?t p ph?i ???c dng theo ??n ? k. Thu?c c?ng s? khng c tc d?ng khi qu v? b? li?u. Vi?c b? li?u thu?c c?ng lm qu v? c nguy c? pht sinh v?n ??. Hy lin l?c v?i chuyn gia ch?m  s?c kh?e n?u:  Qu v? ngh? qu v? c ph?n ?ng v?i thu?c ? dng.  Qu v? b? ?au ??u l?p ?i l?p l?i (tr? l?i).  Qu v? c?m th?y chng m?t.  Qu v? b? s?ng ph ? m?t c chn.  Qu v? c v?n ?? v? th? l?c. Yu c?u tr? gip ngay l?p t?c n?u:  Qu v? b? ?au ??u n?ng ho?c l l?n.  Qu v? b? y?u b?t th??ng ho?c t b, ho?c c?m th?y b? ng?t.  Qu v? b? ?au r?t nhi?u ? ng?c ho?c b?ng.  Qu v? nn nhi?u l?n.  Qu v? b? kh th?. Tm t?t  T?ng huy?t p l khi l?c b?m mu qua ??ng m?ch c?a  qu v? qu m?nh. N?u tnh tr?ng ny khng ???c ki?m sot, n c th? khi?n qu v? g?p ph?i nguy c? bi?n ch?ng nghim tr?ng.  Huy?t p m?c tiu c nhn c?a qu v? c th? khc nhau ty thu?c v tnh tr?ng b?nh l, tu?i v cc nhn t? khc. ??i v?i h?u h?t m?i ng??i, huy?t p bnh th??ng l d??i 120/80.  Qu?n l t?ng huy?t p b?ng cch thay ??i l?i s?ng, dng thu?c, ho?c k?t h?p c? hai. Thay ??i l?i s?ng bao g?m gi?m cn, ?n ch? ?? ?n c l?i cho s?c kh?e, t mu?i, t?p th? d?c nhi?u h?n v h?n ch? u?ng r??u. Thng tin ny khng nh?m m?c ?ch thay th? cho l?i khuyn m chuyn gia ch?m [place] s?c kh?e ni v?i qu v?. Hy b?o ??m qu v? ph?i th?o lu?n b?t k? v?n ?? g m qu v? c v?i chuyn gia ch?m [place] s?c kh?e c?a qu v?. Document Released: 06/22/2016 Document Revised: 06/22/2016 Document Reviewed: 06/22/2016 Elsevier Interactive Patient Education  2018 [employer].

## 2017-11-17 NOTE — Progress Notes (Signed)
Employee physical form completed and in inbox ready to be faxed   Your labs look good - normal kidney function - cholesterol in a healthy range - normal blood counts - no evidence of diabetes

## 2017-11-24 ENCOUNTER — Ambulatory Visit (INDEPENDENT_AMBULATORY_CARE_PROVIDER_SITE_OTHER): Payer: 59

## 2017-11-24 DIAGNOSIS — Z1231 Encounter for screening mammogram for malignant neoplasm of breast: Secondary | ICD-10-CM | POA: Diagnosis not present

## 2017-11-27 NOTE — Progress Notes (Signed)
Negative mammo Repeat screening in 1 year We still need her records from the physician in [facility] for her IUD and Pap

## 2018-05-18 ENCOUNTER — Ambulatory Visit: Payer: 59 | Admitting: Physician Assistant

## 2019-01-02 ENCOUNTER — Other Ambulatory Visit: Payer: Self-pay | Admitting: Family Medicine

## 2021-05-09 ENCOUNTER — Inpatient Hospital Stay (HOSPITAL_COMMUNITY): Payer: 59

## 2021-05-09 ENCOUNTER — Emergency Department (HOSPITAL_COMMUNITY): Payer: 59

## 2021-05-09 ENCOUNTER — Encounter (HOSPITAL_COMMUNITY): Payer: Self-pay | Admitting: Emergency Medicine

## 2021-05-09 ENCOUNTER — Other Ambulatory Visit: Payer: Self-pay

## 2021-05-09 ENCOUNTER — Inpatient Hospital Stay (HOSPITAL_COMMUNITY)
Admission: EM | Admit: 2021-05-09 | Discharge: 2021-05-10 | DRG: 065 | Disposition: A | Discharge status: Home / Self Care | Payer: 59 | Attending: Family Medicine | Admitting: Family Medicine

## 2021-05-09 DIAGNOSIS — R200 Anesthesia of skin: Secondary | ICD-10-CM

## 2021-05-09 DIAGNOSIS — R29701 NIHSS score 1: Secondary | ICD-10-CM | POA: Diagnosis present

## 2021-05-09 DIAGNOSIS — Z8249 Family history of ischemic heart disease and other diseases of the circulatory system: Secondary | ICD-10-CM | POA: Diagnosis not present

## 2021-05-09 DIAGNOSIS — I10 Essential (primary) hypertension: Secondary | ICD-10-CM | POA: Diagnosis present

## 2021-05-09 DIAGNOSIS — G4733 Obstructive sleep apnea (adult) (pediatric): Secondary | ICD-10-CM | POA: Diagnosis present

## 2021-05-09 DIAGNOSIS — I69354 Hemiplegia and hemiparesis following cerebral infarction affecting left non-dominant side: Secondary | ICD-10-CM

## 2021-05-09 DIAGNOSIS — M722 Plantar fascial fibromatosis: Secondary | ICD-10-CM | POA: Diagnosis present

## 2021-05-09 DIAGNOSIS — M1712 Unilateral primary osteoarthritis, left knee: Secondary | ICD-10-CM | POA: Diagnosis present

## 2021-05-09 DIAGNOSIS — Z791 Long term (current) use of non-steroidal anti-inflammatories (NSAID): Secondary | ICD-10-CM

## 2021-05-09 DIAGNOSIS — K219 Gastro-esophageal reflux disease without esophagitis: Secondary | ICD-10-CM | POA: Diagnosis present

## 2021-05-09 DIAGNOSIS — R202 Paresthesia of skin: Secondary | ICD-10-CM | POA: Diagnosis not present

## 2021-05-09 DIAGNOSIS — R42 Dizziness and giddiness: Secondary | ICD-10-CM | POA: Diagnosis not present

## 2021-05-09 DIAGNOSIS — Z823 Family history of stroke: Secondary | ICD-10-CM | POA: Diagnosis not present

## 2021-05-09 DIAGNOSIS — R112 Nausea with vomiting, unspecified: Secondary | ICD-10-CM | POA: Diagnosis present

## 2021-05-09 DIAGNOSIS — I6329 Cerebral infarction due to unspecified occlusion or stenosis of other precerebral arteries: Secondary | ICD-10-CM | POA: Diagnosis not present

## 2021-05-09 DIAGNOSIS — I6350 Cerebral infarction due to unspecified occlusion or stenosis of unspecified cerebral artery: Secondary | ICD-10-CM | POA: Diagnosis not present

## 2021-05-09 DIAGNOSIS — Z20822 Contact with and (suspected) exposure to covid-19: Secondary | ICD-10-CM | POA: Diagnosis present

## 2021-05-09 DIAGNOSIS — E785 Hyperlipidemia, unspecified: Secondary | ICD-10-CM | POA: Diagnosis present

## 2021-05-09 DIAGNOSIS — I6389 Other cerebral infarction: Secondary | ICD-10-CM | POA: Diagnosis not present

## 2021-05-09 LAB — CBC WITH DIFFERENTIAL/PLATELET
Abs Immature Granulocytes: 0.02 10*3/uL (ref 0.00–0.07)
Basophils Absolute: 0 10*3/uL (ref 0.0–0.1)
Basophils Relative: 0 %
Eosinophils Absolute: 0.3 10*3/uL (ref 0.0–0.5)
Eosinophils Relative: 6 %
HCT: 42.3 % (ref 36.0–46.0)
Hemoglobin: 14.1 g/dL (ref 12.0–15.0)
Immature Granulocytes: 0 %
Lymphocytes Relative: 29 %
Lymphs Abs: 1.7 10*3/uL (ref 0.7–4.0)
MCH: 29.2 pg (ref 26.0–34.0)
MCHC: 33.3 g/dL (ref 30.0–36.0)
MCV: 87.6 fL (ref 80.0–100.0)
Monocytes Absolute: 0.4 10*3/uL (ref 0.1–1.0)
Monocytes Relative: 7 %
Neutro Abs: 3.4 10*3/uL (ref 1.7–7.7)
Neutrophils Relative %: 58 %
Platelets: 344 10*3/uL (ref 150–400)
RBC: 4.83 MIL/uL (ref 3.87–5.11)
RDW: 13.4 % (ref 11.5–15.5)
WBC: 5.9 10*3/uL (ref 4.0–10.5)
nRBC: 0 % (ref 0.0–0.2)

## 2021-05-09 LAB — RESP PANEL BY RT-PCR (FLU A&B, COVID) ARPGX2
Influenza A by PCR: NEGATIVE
Influenza B by PCR: NEGATIVE
SARS Coronavirus 2 by RT PCR: NEGATIVE

## 2021-05-09 LAB — PROTIME-INR
INR: 1 (ref 0.8–1.2)
Prothrombin Time: 13.2 seconds (ref 11.4–15.2)

## 2021-05-09 LAB — RAPID URINE DRUG SCREEN, HOSP PERFORMED
Amphetamines: NOT DETECTED
Barbiturates: NOT DETECTED
Benzodiazepines: NOT DETECTED
Cocaine: NOT DETECTED
Opiates: NOT DETECTED
Tetrahydrocannabinol: NOT DETECTED

## 2021-05-09 LAB — URINALYSIS, ROUTINE W REFLEX MICROSCOPIC
Bilirubin Urine: NEGATIVE
Glucose, UA: NEGATIVE mg/dL
Hgb urine dipstick: NEGATIVE
Ketones, ur: NEGATIVE mg/dL
Leukocytes,Ua: NEGATIVE
Nitrite: NEGATIVE
Protein, ur: NEGATIVE mg/dL
Specific Gravity, Urine: 1.013 (ref 1.005–1.030)
pH: 6 (ref 5.0–8.0)

## 2021-05-09 LAB — COMPREHENSIVE METABOLIC PANEL
ALT: 16 U/L (ref 0–44)
AST: 16 U/L (ref 15–41)
Albumin: 3.9 g/dL (ref 3.5–5.0)
Alkaline Phosphatase: 40 U/L (ref 38–126)
Anion gap: 9 (ref 5–15)
BUN: 12 mg/dL (ref 6–20)
CO2: 20 mmol/L — ABNORMAL LOW (ref 22–32)
Calcium: 9 mg/dL (ref 8.9–10.3)
Chloride: 108 mmol/L (ref 98–111)
Creatinine, Ser: 0.69 mg/dL (ref 0.44–1.00)
GFR, Estimated: >60 mL/min (ref >60)
Glucose, Bld: 95 mg/dL (ref 70–99)
Potassium: 3.9 mmol/L (ref 3.5–5.1)
Sodium: 137 mmol/L (ref 135–145)
Total Bilirubin: 0.7 mg/dL (ref 0.3–1.2)
Total Protein: 7.2 g/dL (ref 6.5–8.1)

## 2021-05-09 LAB — ETHANOL: Alcohol, Ethyl (B): <10 mg/dL (ref <10)

## 2021-05-09 LAB — TROPONIN I (HIGH SENSITIVITY)
Troponin I (High Sensitivity): <2 ng/L (ref <18)
Troponin I (High Sensitivity): 3 ng/L (ref <18)

## 2021-05-09 LAB — APTT: aPTT: 27 seconds (ref 24–36)

## 2021-05-09 IMAGING — MR MR MRA HEAD W/O CM
2 series · 20 of 48 positions shown · non-contrast
Comparison: No pertinent prior exam.

CLINICAL DATA: Stroke follow-up

EXAM:
MRA HEAD WITHOUT CONTRAST
TECHNIQUE: Angiographic images of the Circle of Willis were acquired using MRA
technique without intravenous contrast.

[Series 2: ax (id) · axial · 1.0mm · 0.43mm/px · z∈[-95,-12]mm · 19 of 187 slices shown]
[im 1/187]
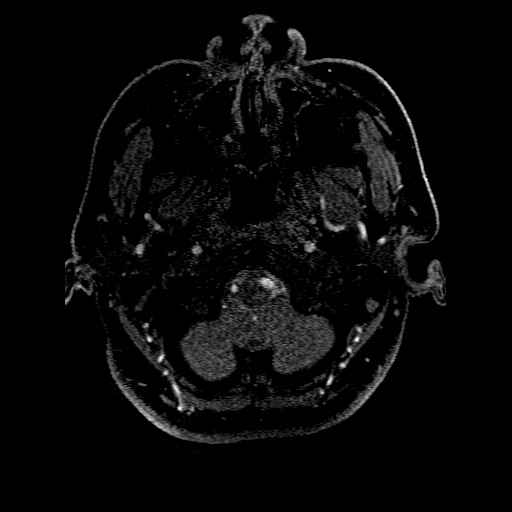
[im 5/187]
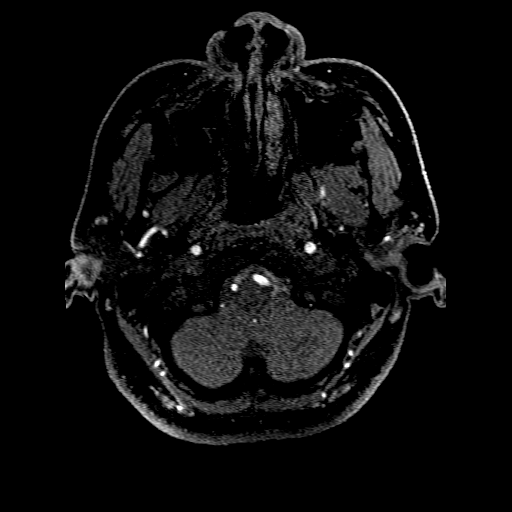
[im 9/187]
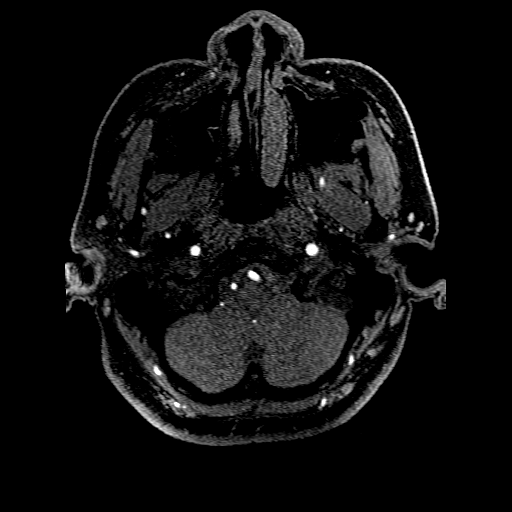
[im 13/187]
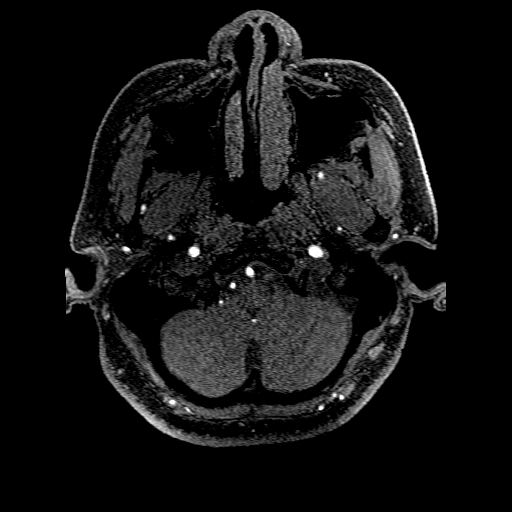
[im 17/187]
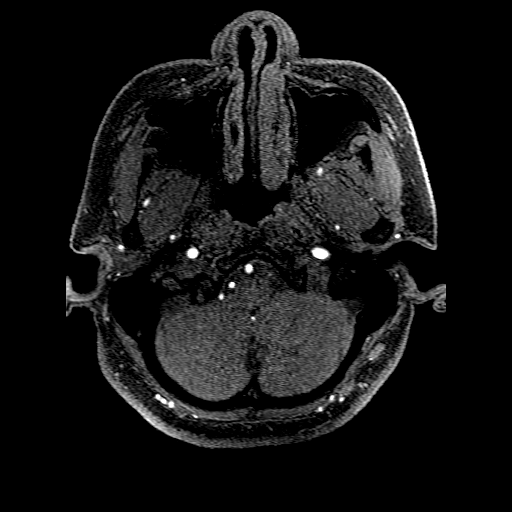
[im 21/187]
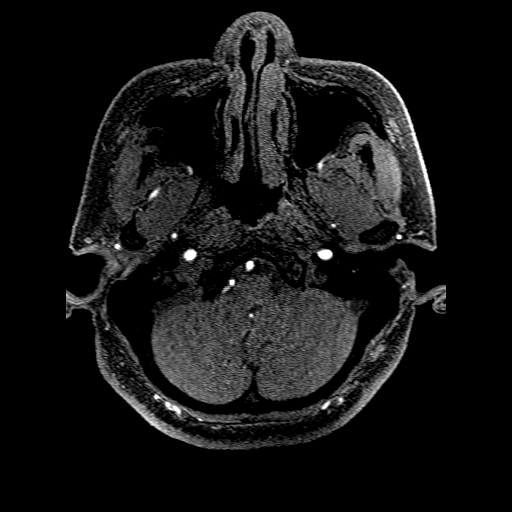
[im 25/187]
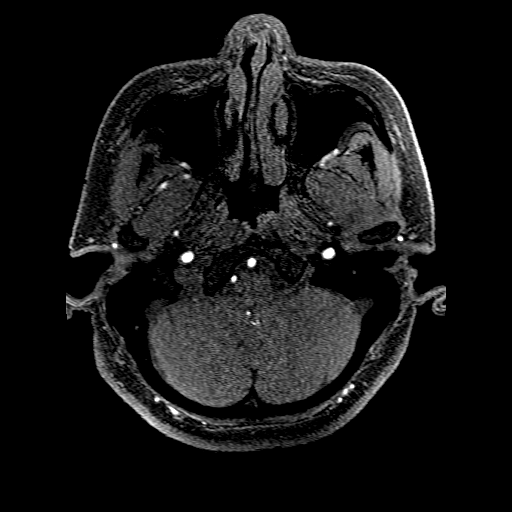
[im 29/187]
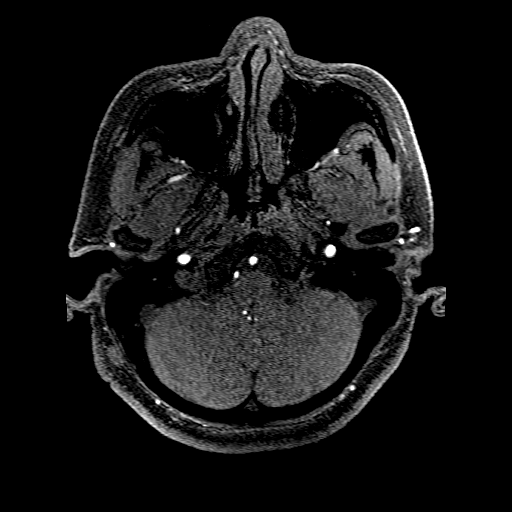
[im 33/187]
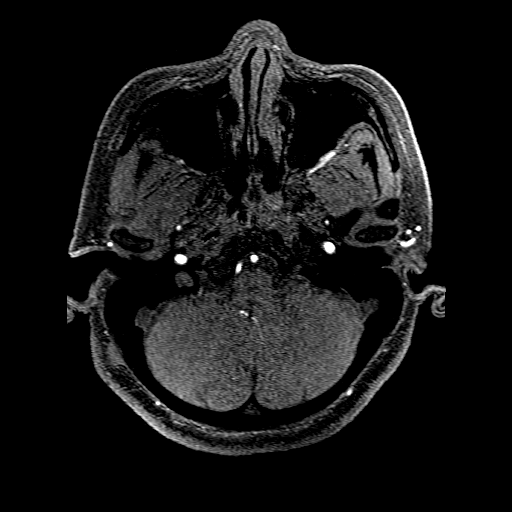
[im 37/187]
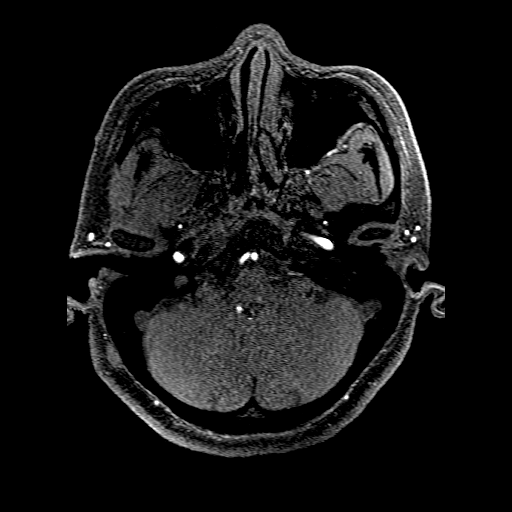
[im 41/187]
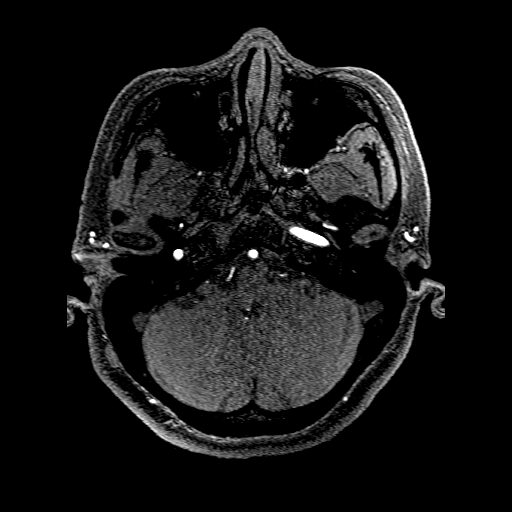
[im 57/187]
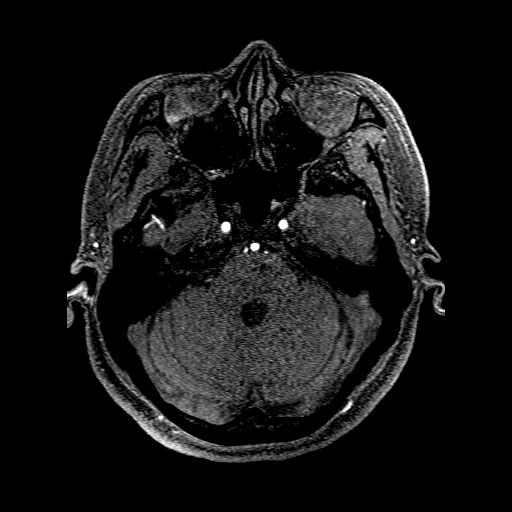
[im 81/187]
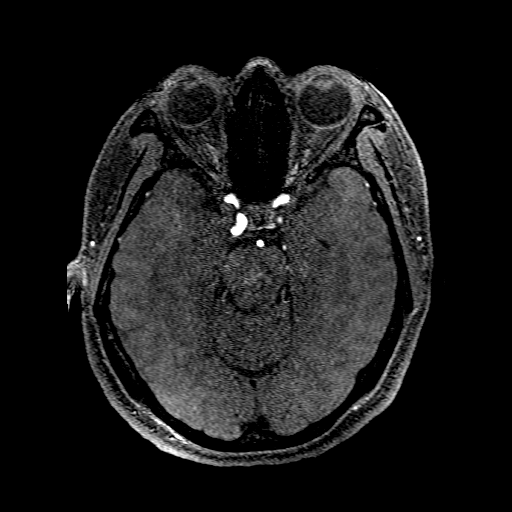
[im 94/187]
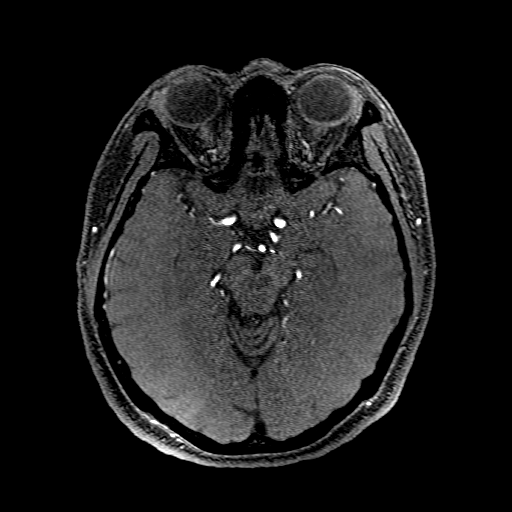
[im 106/187]
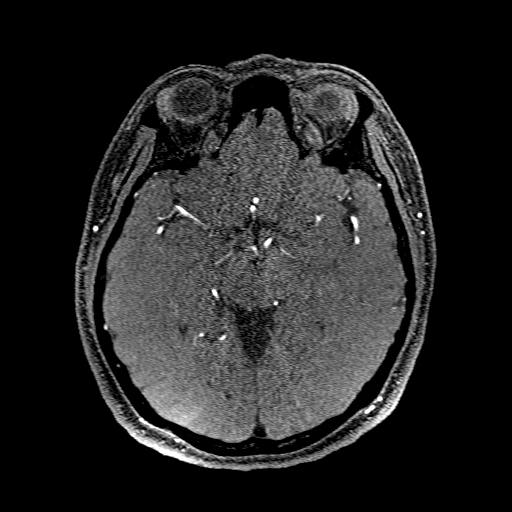
[im 130/187]
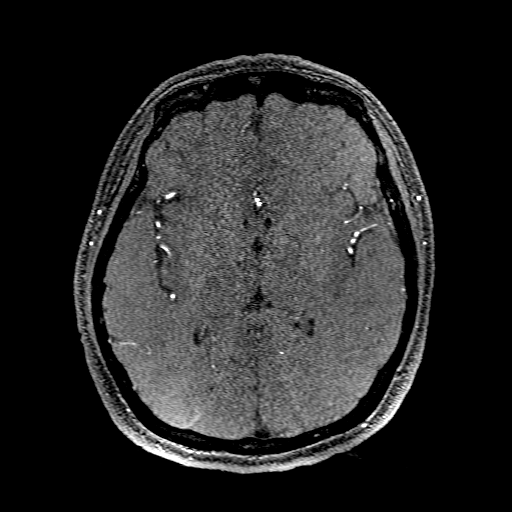
[im 154/187]
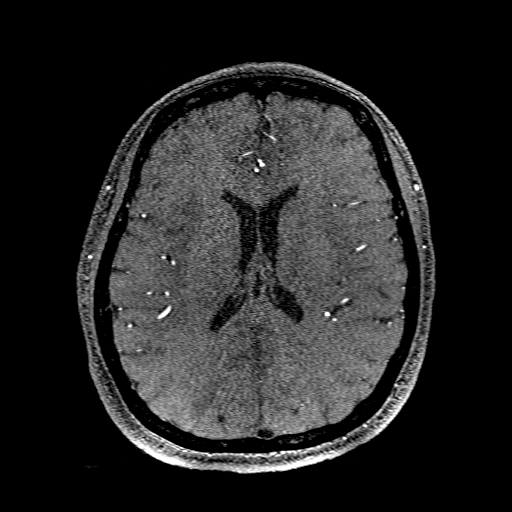
[im 158/187]
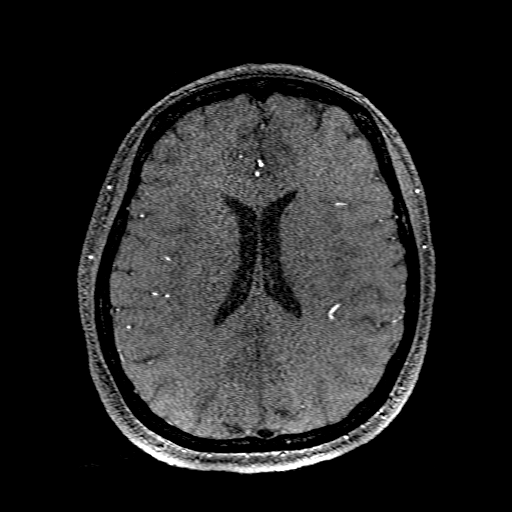
[im 178/187]
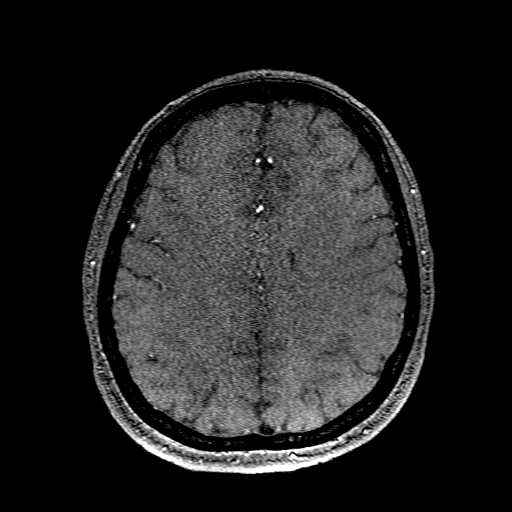

[Series 201: pjn:ax (id) · sagittal · 1.0mm · 0.43mm/px · 1 of 3 slices shown]
[im 1/3]
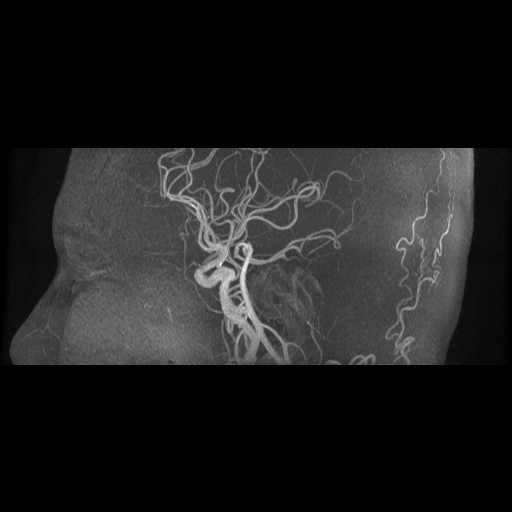

[20 of 48 positions shown; findings below may reference images not displayed]

FINDINGS: POSTERIOR CIRCULATION:

--Vertebral arteries: Normal

--Inferior cerebellar arteries: Normal.

--Basilar artery: Normal.

--Superior cerebellar arteries: Normal.

--Posterior cerebral arteries: Normal.

ANTERIOR CIRCULATION:

--Intracranial internal carotid arteries: Normal.

--Anterior cerebral arteries (ACA): Normal.

--Middle cerebral arteries (MCA): Normal.

ANATOMIC VARIANTS: None
IMPRESSION: Normal intracranial MRA.

## 2021-05-09 IMAGING — CT CT HEAD W/O CM
4 series · 17 of 47 positions shown, 19 images · non-contrast
Comparison: None.

CLINICAL DATA: Dizziness, left arm paresthesias

EXAM:
CT HEAD WITHOUT CONTRAST
TECHNIQUE: Contiguous axial images were obtained from the base of the skull
through the vertex without intravenous contrast.

[Series 3: head wo · axial · 0.41mm/px · z∈[-191,-56]mm · 7 of 37 slices shown, 9 images]
[im 5/37  brain]
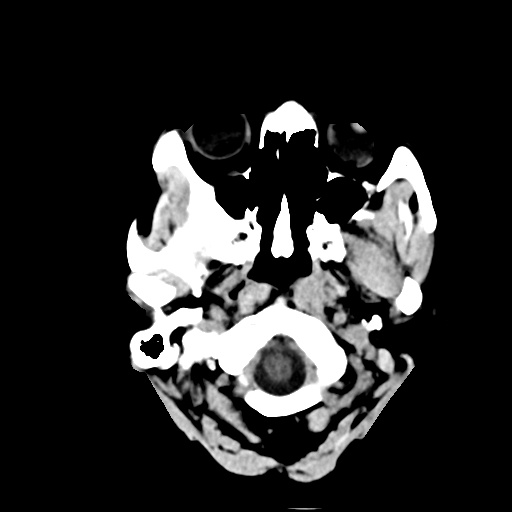
[im 5/37  bone]
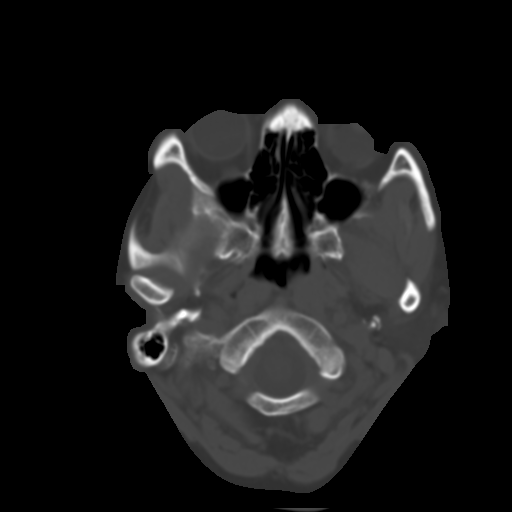
[im 10/37  brain]
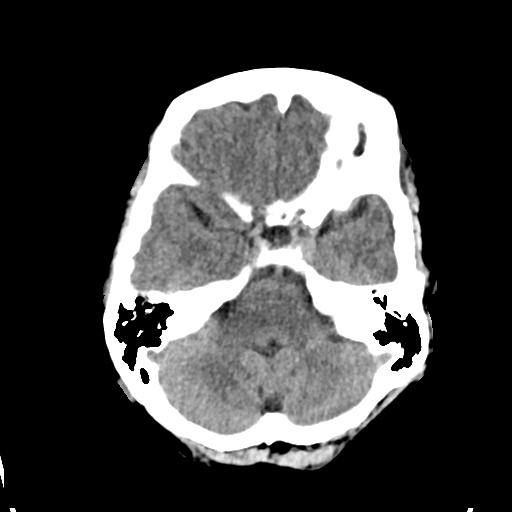
[im 14/37  brain]
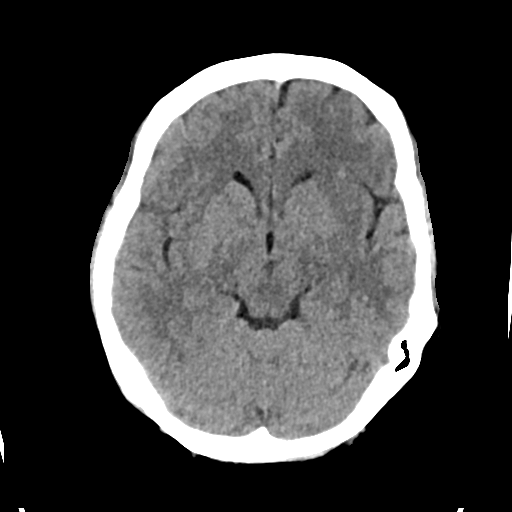
[im 19/37  brain]
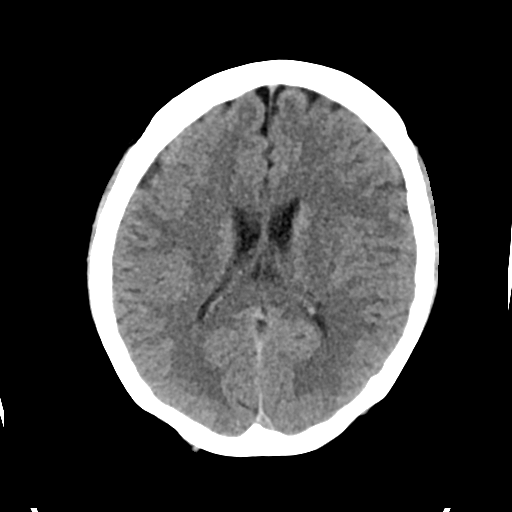
[im 23/37  brain]
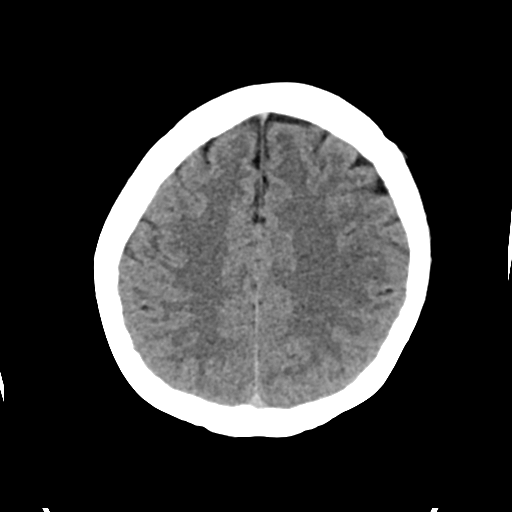
[im 23/37  bone]
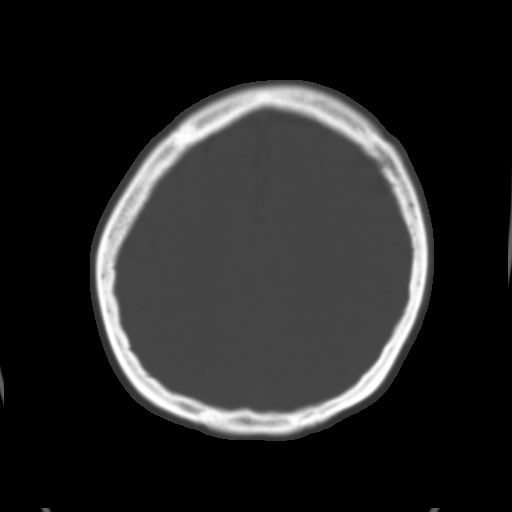
[im 28/37  brain]
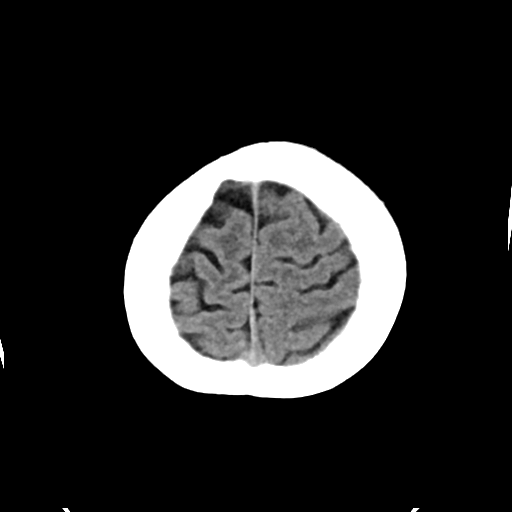
[im 32/37  brain]
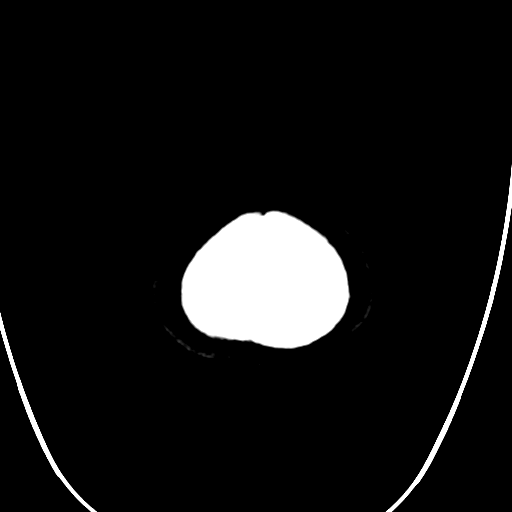

[Series 4: head bone · axial · 0.41mm/px · z∈[-193,-131]mm · 4 of 92 slices shown]
[im 10/92  bone]
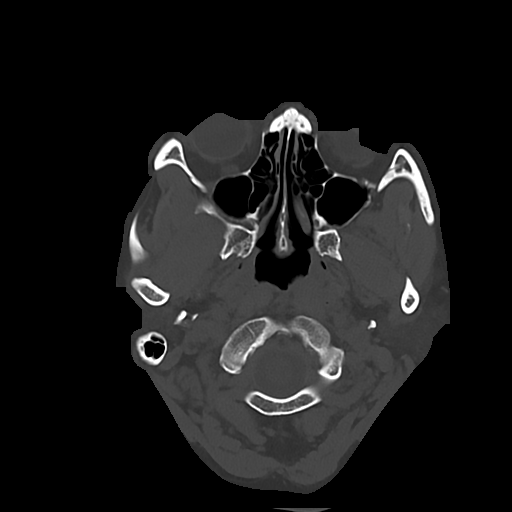
[im 19/92  bone]
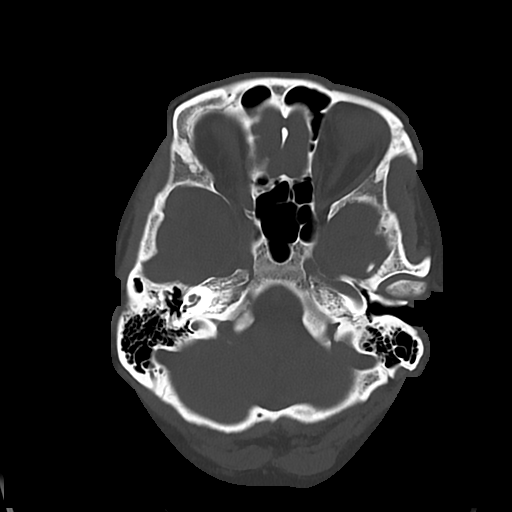
[im 28/92  bone]
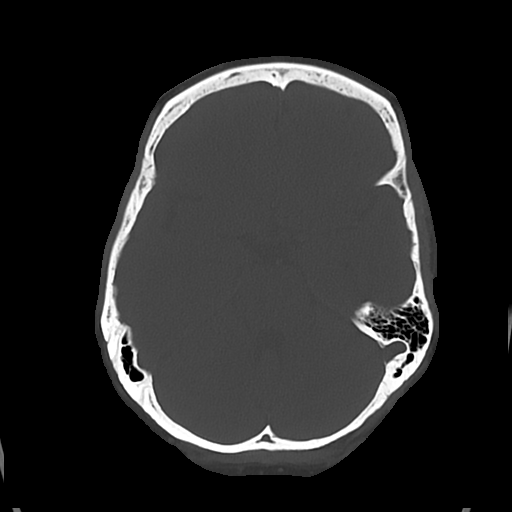
[im 41/92  bone]
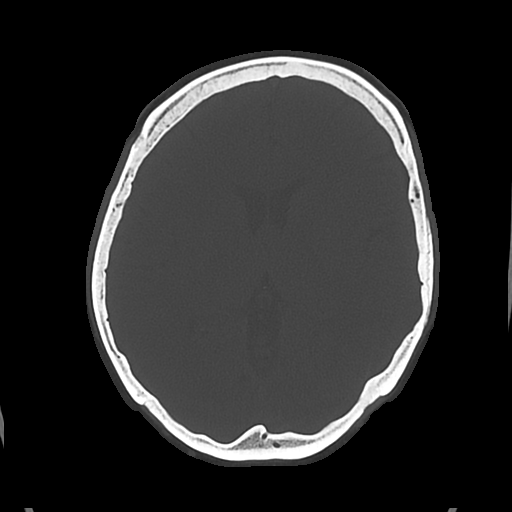

[Series 5: cor soft · coronal · 0.32mm/px · 3 of 62 slices shown]
[im 21/62  brain]
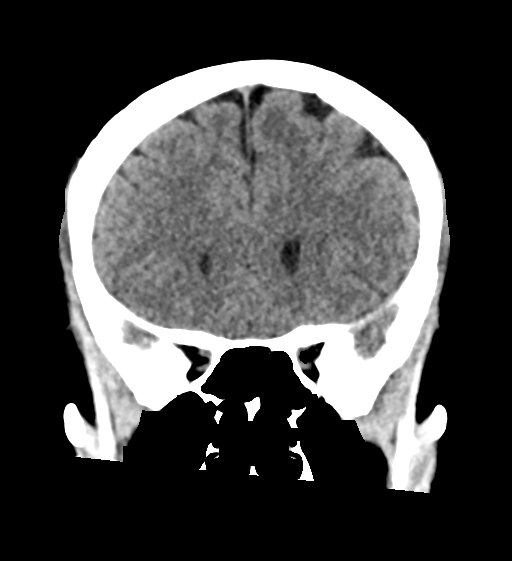
[im 28/62  brain]
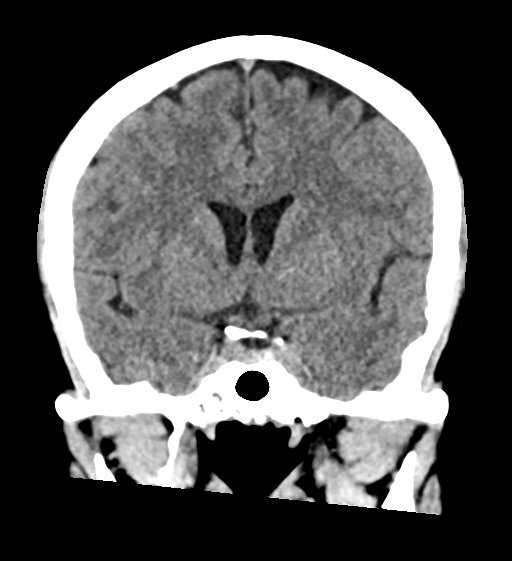
[im 34/62  brain]
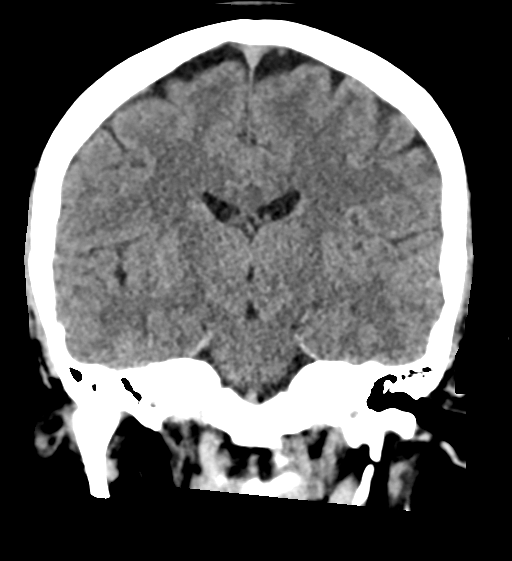

[Series 6: sag soft · sagittal · 0.36mm/px · 3 of 56 slices shown]
[im 19/56  brain]
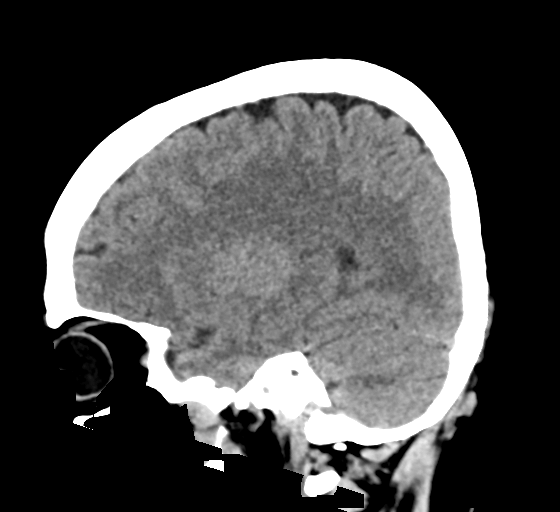
[im 28/56  brain]
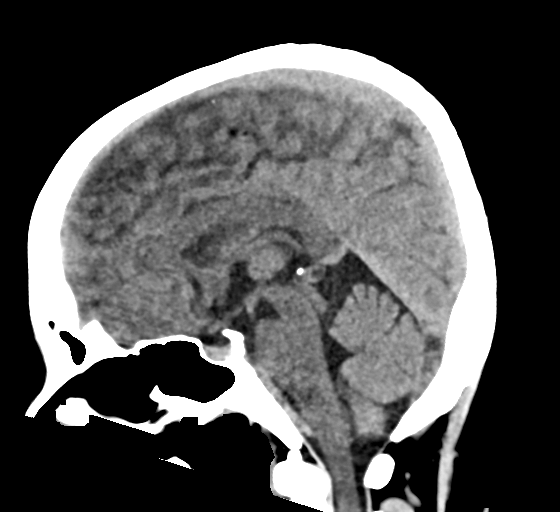
[im 37/56  brain]
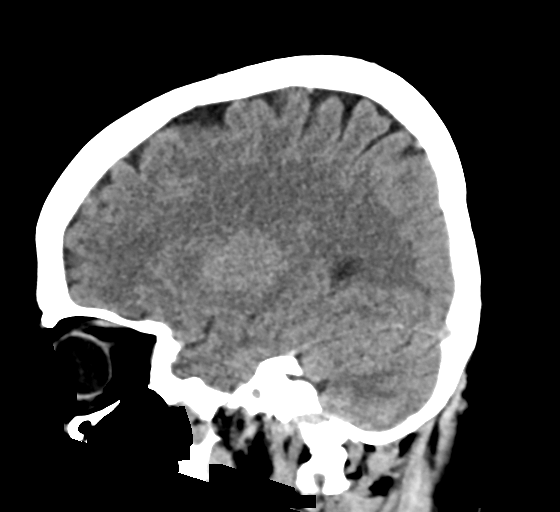

[17 of 47 positions shown; findings below may reference images not displayed]

FINDINGS: Brain: No evidence of acute infarction, hemorrhage, hydrocephalus,
extra-axial collection or mass lesion/mass effect.

Vascular: No hyperdense vessel or unexpected calcification.

Skull: Normal. Negative for fracture or focal lesion.

Sinuses/Orbits: No acute finding.

Other: None.
IMPRESSION: Normal head CT without contrast

## 2021-05-09 IMAGING — MR MR HEAD W/O CM
6 of 10 series · 27 of 48 positions shown · non-contrast
Comparison: Same day CT head.

CLINICAL DATA: Dizziness, non-specific

EXAM:
MRI HEAD WITHOUT CONTRAST
TECHNIQUE: Multiplanar, multiecho pulse sequences of the brain and surrounding
structures were obtained without intravenous contrast.

[Series 2: DWI · axial · 3.0mm · 0.94mm/px · z∈[-86,+66]mm · 8 of 108 slices shown (1 of 2)]
[im 1/108]
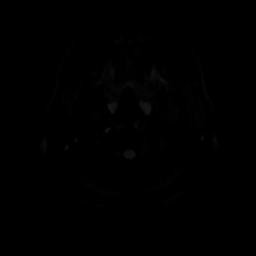
[im 12/108]
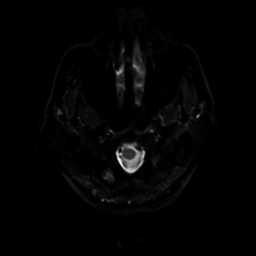
[im 36/108]
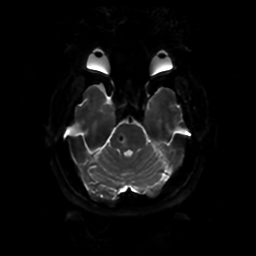
[im 48/108]
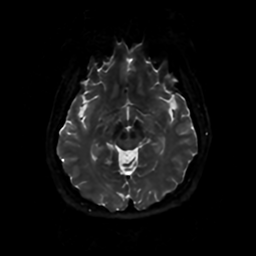
[im 60/108]
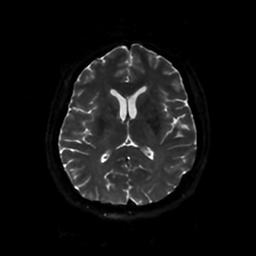
[im 72/108]
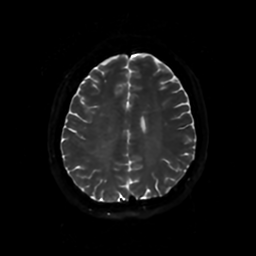
[im 96/108]
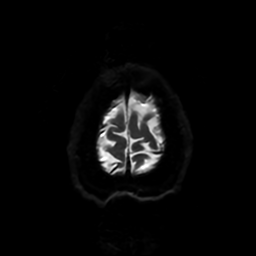
[im 108/108]
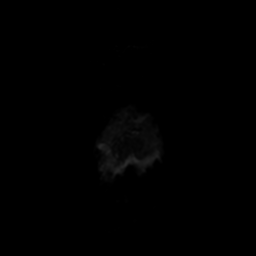

[Series 3: DWI · coronal · 4.0mm · 0.94mm/px · 6 of 68 slices shown (2 of 2)]
[im 1/68]
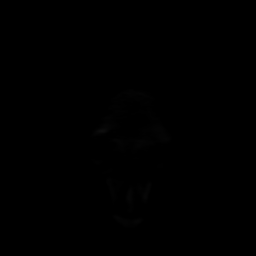
[im 14/68]
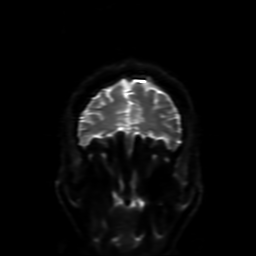
[im 27/68]
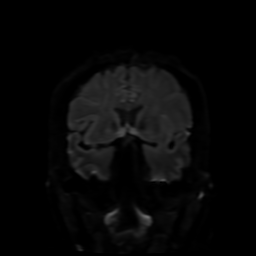
[im 41/68]
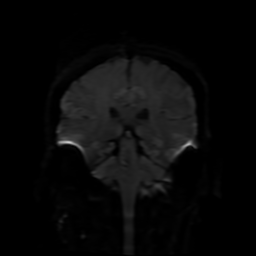
[im 54/68]
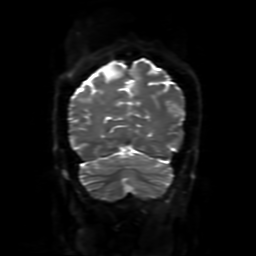
[im 68/68]
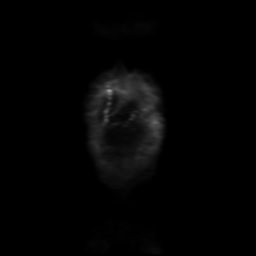

[Series 4: FLAIR · sagittal · 5.0mm · 0.23mm/px · 2 of 23 slices shown (1 of 2)]
[im 1/23]
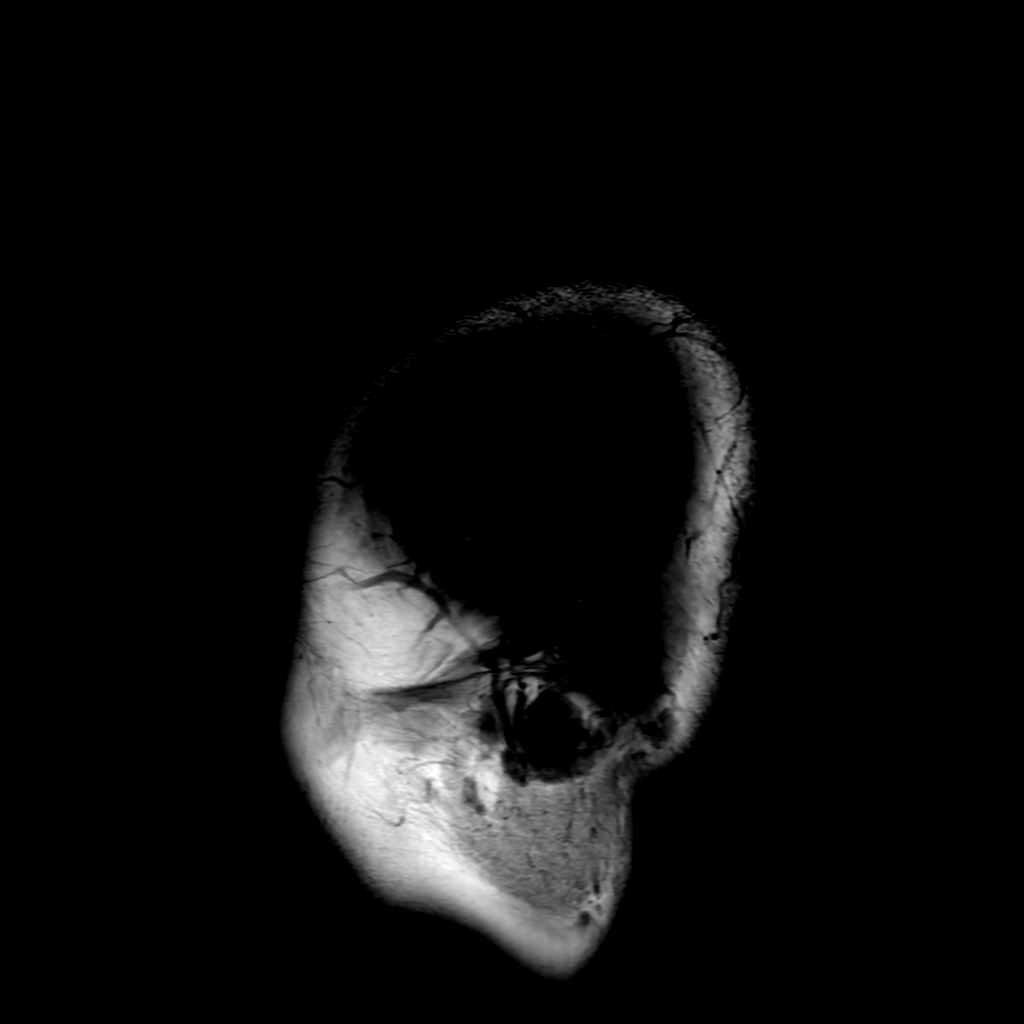
[im 23/23]
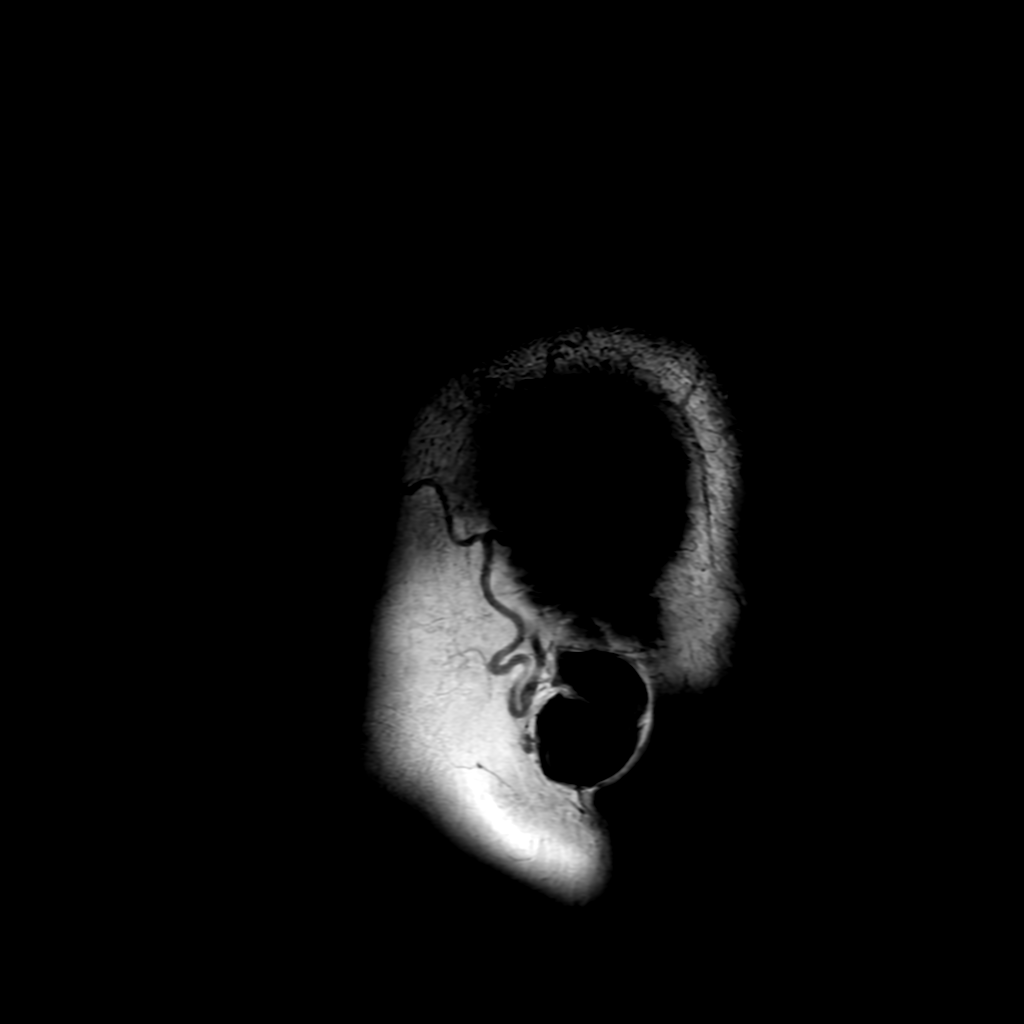

[Series 6: FLAIR · axial · 4.0mm · 0.45mm/px · z∈[-85,+67]mm · 3 of 37 slices shown (2 of 2)]
[im 1/37]
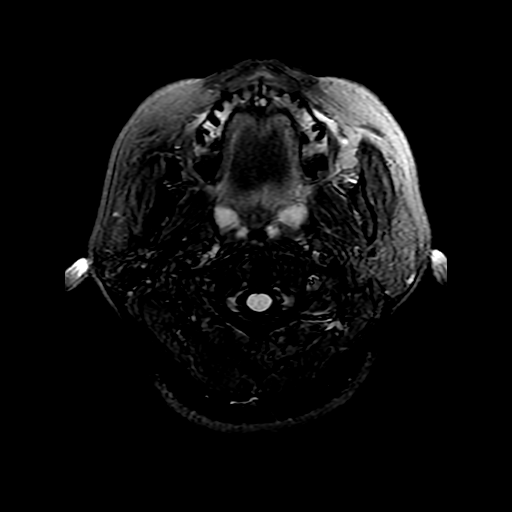
[im 19/37]
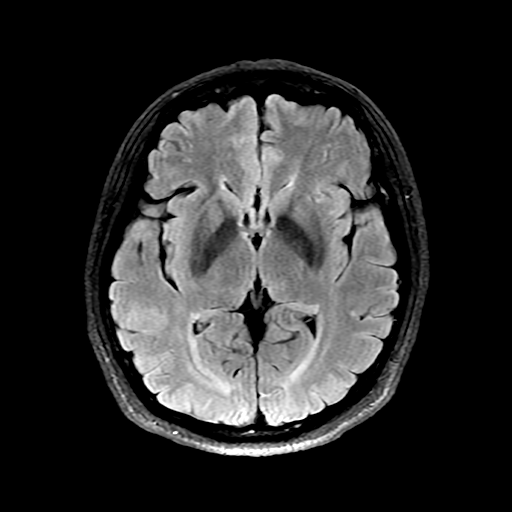
[im 37/37]
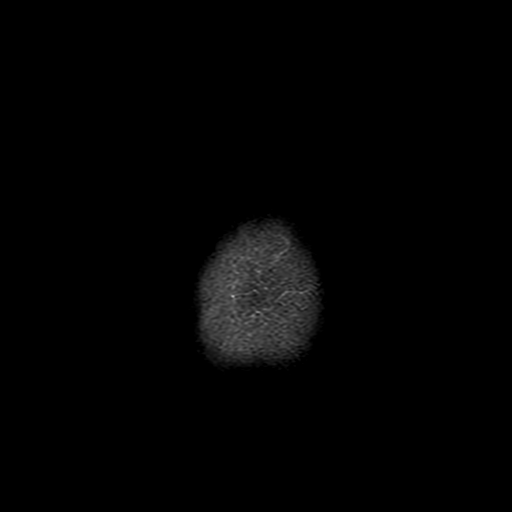

[Series 250: ADC · axial · 3.0mm · 0.94mm/px · z∈[-86,+66]mm · 5 of 54 slices shown (1 of 2)]
[im 1/54]
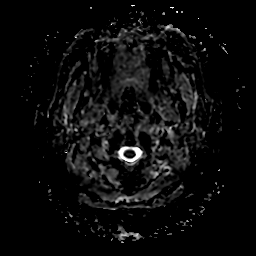
[im 14/54]
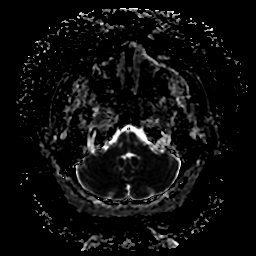
[im 27/54]
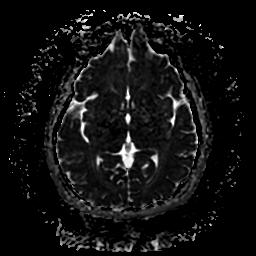
[im 40/54]
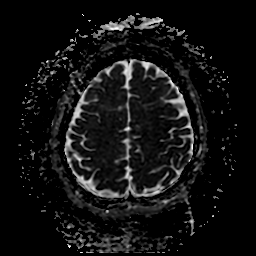
[im 54/54]
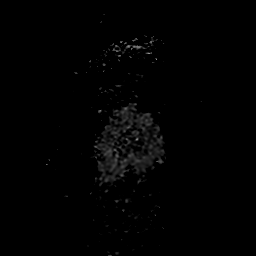

[Series 350: ADC · coronal · 4.0mm · 0.94mm/px · 3 of 32 slices shown (2 of 2)]
[im 1/32]
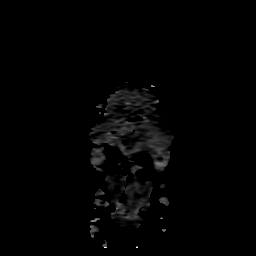
[im 16/32]
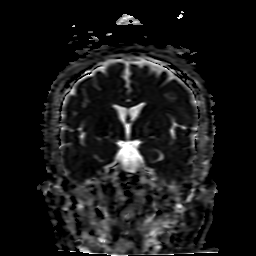
[im 32/32]
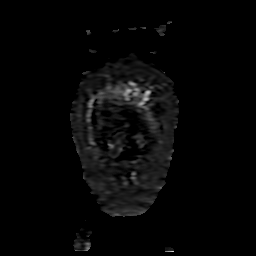

[27 of 48 positions shown; findings below may reference images not displayed]

FINDINGS: Brain: Restricted diffusion in the right pons, compatible with acute
infarct. Mild edema without mass effect. Focus susceptibility
artifact in the right pons in the region of infarct with faint T2
hypointensity (no hyperdensity on same day CT head). No
hydrocephalus, midline shift, or extra-axial fluid collection. Mild
additional scattered T2 hyperintensities throughout the white
matter, nonspecific but compatible with chronic microvascular
ischemic disease.

Vascular: Major arterial flow voids are maintained at the skull
base.

Skull and upper cervical spine: Normal marrow signal.

Sinuses/Orbits: Clear sinuses.  Unremarkable orbits.

Other: No mastoid effusions.
IMPRESSION: 1. Acute right pontine infarct.  Mild edema without mass effect.
2. Focus of susceptibility artifact in the right pons in the region
of infarct, likely a cavernous malformation and/or prior hemorrhage.
Acute hemorrhage is thought unlikely given no hyperdensity on same
day CT head.
3. Mild chronic microvascular ischemic disease

## 2021-05-09 MED ORDER — ASPIRIN 325 MG PO TABS: 325.0000 mg | ORAL_TABLET | Freq: Every day | ORAL | Status: DC

## 2021-05-09 MED ORDER — STROKE: EARLY STAGES OF RECOVERY BOOK
Freq: Once | Status: AC
  Filled 2021-05-09: qty 1

## 2021-05-09 MED ORDER — PANTOPRAZOLE SODIUM 40 MG PO TBEC
40.0000 mg | DELAYED_RELEASE_TABLET | Freq: Every day | ORAL | Status: DC
  Administered 2021-05-10: 40 mg via ORAL
  Filled 2021-05-09: qty 1

## 2021-05-09 MED ORDER — ACETAMINOPHEN 325 MG PO TABS: 650.0000 mg | ORAL_TABLET | ORAL | Status: DC | PRN

## 2021-05-09 MED ORDER — ASPIRIN 300 MG RE SUPP: 300.0000 mg | Freq: Every day | RECTAL | Status: DC

## 2021-05-09 MED ORDER — ACETAMINOPHEN 160 MG/5ML PO SOLN: 650.0000 mg | ORAL | Status: DC | PRN

## 2021-05-09 MED ORDER — ACETAMINOPHEN 650 MG RE SUPP: 650.0000 mg | RECTAL | Status: DC | PRN

## 2021-05-09 MED ORDER — MECLIZINE HCL 25 MG PO TABS
25.0000 mg | ORAL_TABLET | Freq: Once | ORAL | Status: AC
  Administered 2021-05-09: 25 mg via ORAL
  Filled 2021-05-09: qty 1

## 2021-05-09 NOTE — ED Provider Notes (Signed)
5:39 PM signout from [employer] at shift change.  Patient with history of hypertension.  Acute onset of left hand numbness and tingling as well as vertigo yesterday at around 11 AM.  Signed out pending MRI of the brain.  This demonstrated right pontine CVA consistent with the patient's symptoms.  Updated patient and son by telephone.  Plan for admission.  Will discuss with cardiology and admit to medicine.  BP (!) 145/103 (BP Location: Left Arm)   Pulse 86   Temp 98.4 F (36.9 C) (Oral)   Resp 15   SpO2 99%   7:40 PM Spoke with neurology Dr. Derry Lory who reccs med admit, they will consult on patient.   Spoke with family practice who will see patient and admit.     Renne Crigler, PA-C 05/09/21 1941    Gwyneth Sprout, MD 05/19/21 [phone]

## 2021-05-09 NOTE — ED Triage Notes (Signed)
C/o L arm numbness and dizziness since yesterday afternoon.  VAN negative.  No arm drift.

## 2021-05-09 NOTE — ED Provider Notes (Signed)
[facility] EMERGENCY DEPARTMENT Provider Note   CSN: [id number] Arrival date & time: 05/09/21  5631     History Chief Complaint  Patient presents with   Numbness    Abigail Porter is a 45 y.o. female.  Abigail Porter is a 45 y.o. female with hx of HTN and GERD, who presents to the ED for evaluation of dizziness and numbness in her left arm.  She reports symptoms started yesterday around 11 AM.  She reports she had a sudden onset of numbness in her left arm that has been constant and has had dizziness she describes as feeling off balance or like the room is spinning.  No lightheadedness.  She does report that the dizziness made her nauseated and she had 1 episode of vomiting yesterday.  No associated chest pain or shortness of breath.  She has not had any associated weakness in her extremities no numbness in other extremities, no visual changes or speech changes.  No associated headaches.  She has never had dizziness or arm numbness before.  No associated arm pain or neck pain.  She does report that her blood pressures been running higher than usual recently.  She did take her blood pressure medications today.  The history is provided by the patient and medical records.      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   Plantar fasciitis of left foot 09/14/2017    Past Surgical History:  Procedure Laterality Date   INTRAUTERINE DEVICE (IUD) INSERTION       OB History     Gravida  1   Para  1   Term      Preterm      AB      Living  1      SAB      IAB      Ectopic      Multiple      Live Births              Family  History  Problem Relation Age of Onset   Hypertension Mother     Social History   Tobacco Use   Smoking status: Never   Smokeless tobacco: Never  Vaping Use   Vaping Use: Never used  Substance Use Topics   Alcohol use: Never   Drug use: Never    Home Medications Prior to Admission medications   Medication Sig Start Date End Date Taking? Authorizing Provider  diclofenac sodium (VOLTAREN) 1 % GEL APPLY 4 G TOPICALLY 4 TIMES DAILY. TO AFFECTED JOINT. 01/03/19   Rodolph Bong, MD  lisinopril-hydrochlorothiazide (PRINZIDE,ZESTORETIC) 10-12.5 MG tablet Take 1 tablet by mouth daily. 11/16/17   Carlis Stable, PA-C  meloxicam (MOBIC) 15 MG tablet TAKE 1 TABLET BY MOUTH EVERY DAY 10/26/17   Rodolph Bong, MD  omeprazole (PRILOSEC) 20 MG capsule TAKE 1 CAPSULE BY MOUTH EVERY DAY IN THE MORNING, 30 MIN TO 1 HR PRIOR TO BREAKFAST 11/16/17   Carlis Stable, PA-C    Allergies    Patient has no known allergies.  Review of Systems   Review of Systems  Constitutional:  Negative for chills and fever.  HENT: Negative.    Eyes:  Negative for visual disturbance.  Respiratory:  Negative for cough and shortness of breath.   Cardiovascular:  Negative for chest pain.  Gastrointestinal:  Positive for nausea and vomiting. Negative for abdominal pain.  Musculoskeletal:  Negative for arthralgias, back pain, myalgias and neck pain.  Skin:  Negative for color change and rash.  Neurological:  Positive for dizziness and numbness. Negative for tremors, syncope, facial asymmetry, speech difficulty, weakness, light-headedness and headaches.  All other systems reviewed and are negative.  Physical Exam Updated Vital Signs BP (!) 132/91 (BP Location: Right Arm)   Pulse 82   Temp 98.4 F (36.9 C) (Oral)   Resp 18   SpO2 98%   Physical Exam Vitals and nursing note reviewed.  Constitutional:      General: She is not in acute distress.    Appearance: Normal appearance. She is  well-developed. She is not diaphoretic.     Comments: Well-appearing and in no distress   HENT:     Head: Normocephalic and atraumatic.  Eyes:     General:        Right eye: No discharge.        Left eye: No discharge.     Extraocular Movements: Extraocular movements intact.     Pupils: Pupils are equal, round, and reactive to light.     Comments: No nystagmus  Cardiovascular:     Rate and Rhythm: Normal rate and regular rhythm.     Pulses: Normal pulses.     Heart sounds: Normal heart sounds.  Pulmonary:     Effort: Pulmonary effort is normal. No respiratory distress.     Breath sounds: Normal breath sounds. No wheezing or rales.     Comments: Respirations equal and unlabored, patient able to speak in full sentences, lungs clear to auscultation bilaterally  Abdominal:     General: Bowel sounds are normal. There is no distension.     Palpations: Abdomen is soft. There is no mass.     Tenderness: There is no abdominal tenderness. There is no guarding.     Comments: Abdomen soft, nondistended, nontender to palpation in all quadrants without guarding or peritoneal signs  Musculoskeletal:        General: No deformity.     Cervical back: Neck supple.  Skin:    General: Skin is warm and dry.     Capillary Refill: Capillary refill takes less than 2 seconds.  Neurological:     Mental Status: She is alert and oriented to person, place, and time.     Coordination: Coordination normal.     Comments: Speech is clear, able to follow commands CN III-XII intact Normal strength in upper and lower extremities bilaterally including dorsiflexion and plantar flexion, strong and equal grip strength Patient reports decreased sensation throughout the left upper extremity.  Normal sensation reported in all other extremities.   Moves extremities without ataxia, coordination intact No Pronator drift bilaterally.  Finger-to-nose intact.    Psychiatric:        Mood and Affect: Mood normal.         Behavior: Behavior normal.    ED Results / Procedures / Treatments   Labs (all labs ordered are listed, but only abnormal results are displayed) Labs Reviewed  COMPREHENSIVE METABOLIC PANEL - Abnormal; Notable for the following components:      Result Value   CO2 20 (*)    All other components within normal limits  RESP PANEL BY RT-PCR (  FLU A&B, COVID) ARPGX2  CBC WITH DIFFERENTIAL/PLATELET  ETHANOL  PROTIME-INR  APTT  RAPID URINE DRUG SCREEN, HOSP PERFORMED  URINALYSIS, ROUTINE W REFLEX MICROSCOPIC  I-STAT CHEM 8, ED  I-STAT BETA HCG BLOOD, ED ([facility], [facility], [facility] ONLY)  TROPONIN I (HIGH SENSITIVITY)  TROPONIN I (HIGH SENSITIVITY)    EKG None  Radiology CT HEAD WO CONTRAST  Result Date: 05/09/2021 CLINICAL DATA:  Dizziness, left arm paresthesias EXAM: CT HEAD WITHOUT CONTRAST TECHNIQUE: Contiguous axial images were obtained from the base of the skull through the vertex without intravenous contrast. COMPARISON:  None. FINDINGS: Brain: No evidence of acute infarction, hemorrhage, hydrocephalus, extra-axial collection or mass lesion/mass effect. Vascular: No hyperdense vessel or unexpected calcification. Skull: Normal. Negative for fracture or focal lesion. Sinuses/Orbits: No acute finding. Other: None. IMPRESSION: Normal head CT without contrast Electronically Signed   By: Judie Petit.  Shick M.D.   On: 05/09/2021 12:04    Procedures Procedures   Medications Ordered in ED Medications  meclizine (ANTIVERT) tablet 25 mg (25 mg Oral Given 05/09/21 1335)    ED Course  I have reviewed the triage vital signs and the nursing notes.  Pertinent labs & imaging results that were available during my care of the patient were reviewed by me and considered in my medical decision making (see chart for details).    MDM Rules/Calculators/A&P                           45 year old female presents reporting left arm numbness and dizziness since 11 AM yesterday.  Symptoms have been constant.  No prior  history.  No associated headache.  Denies visual changes, speech changes, or any numbness or weakness in other extremities.  On exam patient reports subjective decrease sensation in the left arm and dizziness that she describes as feeling off balance.  No focal deficits noted on exam aside from decreased sensation.  Labs and CT of the head initiated from triage.  Patient is outside of the window for code stroke.  I have independently ordered, reviewed and interpreted all labs and imaging: CBC: No leukocytosis, normal hemoglobin CMP: No significant electrolyte derangements, normal renal and liver function. Troponin: Negative Coags: WNL Ethanol: Negative  CT of the head without acute abnormalities.  Given persistent reported dizziness and decreased sensation we will get MRI of the brain to rule out stroke.  Meclizine given to treat dizziness.  Patient has remained hemodynamically stable is currently alert and in no acute distress.  At shift change care signed out to PA [facility] who will follow up on MRI results, if negative patient can be discharged home with outpatient neurology follow-up.  If there is an acute infarct will need to be admitted for stroke work-up.  Final Clinical Impression(s) / ED Diagnoses Final diagnoses:  Dizziness  Left arm numbness    Rx / DC Orders ED Discharge Orders     None        Dartha Lodge, PA-C 05/09/21 1444    Melene Plan, DO 05/09/21 1520

## 2021-05-09 NOTE — ED Provider Notes (Addendum)
Emergency Medicine Provider Triage Evaluation Note  Abigail Porter , a 45 y.o. female  was evaluated in triage.  Pt complains of left arm numbness.  Started yesterday afternoon, has been worsening since then.  Denies any pain in her chest, she does feel somewhat short of breath.  Had 1 episode of emesis yesterday, also felt dizzy at the time.  Denies any headache..  No trauma or injury to the arm.   Also felt dizzy when it started.  Currently not having headache.  Review of Systems  Positive: Left arm numbness Negative: Chest pain, shortness of breath  Physical Exam  There were no vitals taken for this visit. Gen:   Awake, no distress   Resp:  Normal effort  MSK:   Moves extremities without difficulty full range of motion to the left shoulder, elbow, wrist, no tenderness to palpation.  Radial pulse 2+ bilaterally.  Slight swelling to the dorsal side of the hand. Other:  left arm slightly paler than the right.  Same tactile temperature.  Medical Decision Making  Medically screening exam initiated at 10:21 AM.  Appropriate orders placed.  Chaye Misch was informed that the remainder of the evaluation will be completed by another provider, this initial triage assessment does not replace that evaluation, and the importance of remaining in the ED until their evaluation is complete.  Will check labs, vague left arm pain In female, will check an initial troponin.  Although not a code stroke, will proceed with stroke order set vague symptoms, numbness, dizziness.    Theron Arista, PA-C 05/09/21 1025    [address], PA-C 05/09/21 1027    Benjiman Core, MD 05/09/21 2116

## 2021-05-09 NOTE — H&P (Addendum)
Family Medicine Teaching [facility] Admission History and Physical Service Pager: [phone]  Patient name: Abigail Porter Medical record number: [id number] Date of birth: 1976-03-16 Age: 45 y.o. Gender: female  Primary Care Provider: Pcp, No Consultants: Neurology Code Status: FULL Preferred Emergency Contact: Rennis Golden [phone]  Chief Complaint: left hand tingling  Assessment and Plan: Abigail Porter is a 45 y.o. female presenting with one day history of left hand numbness and dizziness . PMH is significant for HTN, OA and GERD  Acute right Pontine CVA  Ischemic Stroke (No TPA) Pt presented to the ED ~20 hours after having headache, dizziness and left hand numbness and tingling. Pt continues to have headache and finger numbness and tingling in her right hand. No extremity weakness but positive Romberg, on exam. In the ED, CT Head was without acute findings however MR Brain showed an acute right pontine stroke with concern for remote hemorrhage. Neurology was consulted by the ED. Given pt was recently started back on anti-hypertensive medication, suspect uncontrolled hypertension. Await neurology evaluation.  Admit to med-tele with cardiac monitoring, attending Dr. Deirdre Priest Neurology consulted by ED, appreciate recommendations Nursing: Vitals per unit routine, out of bed with assistance only, neuro checks q2 A1c, Lipid panel in AM Imaging: MRA, Carotid Dopplers, Echo ordered Start ASA 325mg  Consider plavix  PT, OT: evaluate and treat  NPO until passes speech eval Permissive HTN per neuro evaluation IV labetolol  & notify MD for BP's > 220/120   Hypertension BP on admission 156/104.  Home medications include Losartan 50 mg started on 05/06/21. Pt previously on Lisinopril-HCTZ.  - hold home medication  - permissive HTN <220/<110   GERD Stable. Home medication include Omeprazole 40 mg. -Protonix 40 mg per formulary    FEN/GI: heart healthy diet Prophylaxis: SCDs     Disposition: Admit to med-tele   History of Present Illness:  Abigail Porter is a 45 y.o. female presenting with left hand numbness and tingling for the past day.   Around noon yesterday, patient was at home cooking in the kitchen with her family and started to feel dizzy. She felt room-spinning dizziness worse with walking. Felt like she was stumbling around. No falls occurred. She went to lay down but dizziness persisted upon waking. Notes she had a headache in the back of her head. She vomited once. Has had some nausea today. Continues to have numbness and tingling in the fingers in her left hand. Denies extremity weakness. No confusion noted by her husband. She continues to have a headache.   Notes that last month she had a headache in the back of her head. She went to see her doctor and was given "an injection" in her buttocks which helped. Her headache went away. She say her doctor again recently for foot pain and was given Flexeril and Losartan. States her blood pressure was elevated.   In the ED, pt given meclizine. CT Head without acute stroke but MRI showed right pontine stroke. Neurology was consulted by ED physician.   Review Of Systems: Per HPI with the following additions:   Review of Systems  Constitutional:  Negative for chills and fever.  HENT:  Negative for sore throat.   Eyes:  Negative for visual disturbance.  Respiratory:  Negative for shortness of breath.   Cardiovascular:  Negative for chest pain.  Gastrointestinal:  Positive for nausea and vomiting. Negative for abdominal pain and diarrhea.  Genitourinary:  Negative for dysuria.  Musculoskeletal:  Negative for neck pain.  Foot pain  Neurological:  Positive for dizziness and headaches. Negative for facial asymmetry.  Psychiatric/Behavioral:  Negative for confusion.     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  Family History: Family History  Problem Relation Age of Onset   Hypertension Mother     Allergies and Medications: No Known Allergies No current facility-administered medications on file prior to encounter.   Current Outpatient Medications on File Prior to Encounter  Medication Sig Dispense Refill   diclofenac sodium (VOLTAREN) 1 % GEL APPLY 4 G TOPICALLY 4 TIMES DAILY. TO AFFECTED JOINT. 100 g 11   lisinopril-hydrochlorothiazide (PRINZIDE,ZESTORETIC) 10-12.5 MG tablet Take 1 tablet by mouth daily. 90 tablet 1   meloxicam (MOBIC) 15 MG tablet TAKE 1 TABLET BY MOUTH EVERY DAY 30 tablet 0   omeprazole (PRILOSEC) 20 MG capsule TAKE 1 CAPSULE BY MOUTH EVERY DAY IN THE MORNING, 30 MIN TO 1 HR PRIOR TO BREAKFAST 90 capsule 1    Objective: BP (!) 145/103 (BP Location: Left Arm)   Pulse 86   Temp 98.4 F (36.9 C) (Oral)   Resp 15   SpO2 99%   Exam:  GEN:     alert, cooperative and no distress    HENT:  mucus membranes moist, oropharyngeal without lesions or erythema,  nares patent, no nasal discharge, uvula midline, atraumatic scalp EYES:   pupils equal and reactive, EOM intact NECK:  supple, normal ROM, no meningismus  RESP:  clear to auscultation bilaterally, no increased work of  breathing  CVS:   regular rate and rhythm, no murmur, distal pulses intact   ABD:  soft, non-tender; bowel sounds present; no palpable masses EXT:   normal ROM, atraumatic, no edema  NEURO:  CN 2-12 grossly intact, sensation intact, speech normal, no facial droop, alert and oriented x4, strength 5/5 bilateral UE and LE, normal finger to nose, Romberg positive, normal heel-to-shin Skin:   warm and dry Psych: Normal affect, appropriate speech and behavior    Labs and Imaging: CBC BMET  Recent Labs  Lab 05/09/21 1031  WBC 5.9  HGB 14.1  HCT 42.3  PLT 344   Recent Labs  Lab 05/09/21 1029  NA 137  K 3.9  CL 108  CO2 20*  BUN 12  CREATININE 0.69  GLUCOSE 95  CALCIUM 9.0     EKG: personally reviewed by me, HR 90, NSR, Normal axis, No acute ST or T wave changes  CT HEAD WO CONTRAST  Result Date: 05/09/2021 CLINICAL DATA:  Dizziness, left arm paresthesias EXAM: CT HEAD WITHOUT CONTRAST TECHNIQUE: Contiguous axial images were obtained from the base of the skull through the vertex without  intravenous contrast. COMPARISON:  None. FINDINGS: Brain: No evidence of acute infarction, hemorrhage, hydrocephalus, extra-axial collection or mass lesion/mass effect. Vascular: No hyperdense vessel or unexpected calcification. Skull: Normal. Negative for fracture or focal lesion. Sinuses/Orbits: No acute finding. Other: None. IMPRESSION: Normal head CT without contrast Electronically Signed   By: Judie Petit.  Shick M.D.   On: 05/09/2021 12:04   MR BRAIN WO CONTRAST  Result Date: 05/09/2021 CLINICAL DATA:  Dizziness, non-specific EXAM: MRI HEAD WITHOUT CONTRAST TECHNIQUE: Multiplanar, multiecho pulse sequences of the brain and surrounding structures were obtained without intravenous contrast. COMPARISON:  Same day CT head. FINDINGS: Brain: Restricted diffusion in the right pons, compatible with acute infarct. Mild edema without mass effect. Focus susceptibility artifact in the right pons in the region of  infarct with faint T2 hypointensity (no hyperdensity on same day CT head). No hydrocephalus, midline shift, or extra-axial fluid collection. Mild additional scattered T2 hyperintensities throughout the white matter, nonspecific but compatible with chronic microvascular ischemic disease. Vascular: Major arterial flow voids are maintained at the skull base. Skull and upper cervical spine: Normal marrow signal. Sinuses/Orbits: Clear sinuses.  Unremarkable orbits. Other: No mastoid effusions. IMPRESSION: 1. Acute right pontine infarct.  Mild edema without mass effect. 2. 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 Acute hemorrhage is thought unlikely given no hyperdensity on same day CT head. 3. Mild chronic microvascular ischemic disease Electronically Signed   By: Feliberto Harts M.D.   On: 05/09/2021 16:51     Katha Cabal, DO PGY-3, [facility] 05/09/2021 10:00 PM  FPTS Service pager: [phone] (text pages welcome through [facility])

## 2021-05-09 NOTE — Hospital Course (Addendum)
Abigail Porter is a 45 y.o. female presenting with one day history of left hand numbness and dizziness . PMH is significant for HTN, OA and GERD  Acute right Pontine CVA  Ischemic Stroke (No TPA) Patient presented to the hospital with 1 day of dizziness and numbness in her left hand with an exam significant for mild left-sided weakness.  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  MRA head was normal.  TTE with bubble study showed no intracardiac source of embolism but showed evidence of Grade II diastolic dysfunction, a mildly dilated right atrium, and trivial aortic regurgitation.  Carotid dopplers were unrevealing as cause of stroke. 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 Pt was started on Plavix 75 mg daily and aspirin 81 mg daily. Neurology recommends pt to continue DAPT for 3 weeks and then ASA monotherapy.      Hypertension BP on admission 156/104.  Home medication of losartan 50 mg was held initially for permissive hypertension in the first 24 hours.  Blood pressures remained stable prior to discharge.  Patient was discharged with instructions to resume home blood pressure medication.  Hyperlipidemia  Lipid panel on 05/10/21 showed total cholesterol of 207 and LDL of 126.  Pt was started on Atorvastatin 40 mg daily.   Issues for follow-up: Monitor blood pressure Ensure pt is taking new medications Ensure follow up with neurology in 4 weeks

## 2021-05-09 NOTE — ED Notes (Signed)
Patient transported to MRI 

## 2021-05-10 ENCOUNTER — Inpatient Hospital Stay (HOSPITAL_COMMUNITY): Payer: 59

## 2021-05-10 DIAGNOSIS — I6350 Cerebral infarction due to unspecified occlusion or stenosis of unspecified cerebral artery: Secondary | ICD-10-CM

## 2021-05-10 DIAGNOSIS — R200 Anesthesia of skin: Secondary | ICD-10-CM

## 2021-05-10 DIAGNOSIS — I6389 Other cerebral infarction: Secondary | ICD-10-CM | POA: Diagnosis not present

## 2021-05-10 DIAGNOSIS — I10 Essential (primary) hypertension: Secondary | ICD-10-CM | POA: Diagnosis not present

## 2021-05-10 DIAGNOSIS — R42 Dizziness and giddiness: Secondary | ICD-10-CM

## 2021-05-10 LAB — BASIC METABOLIC PANEL
Anion gap: 8 (ref 5–15)
BUN: 12 mg/dL (ref 6–20)
CO2: 22 mmol/L (ref 22–32)
Calcium: 9.2 mg/dL (ref 8.9–10.3)
Chloride: 107 mmol/L (ref 98–111)
Creatinine, Ser: 0.81 mg/dL (ref 0.44–1.00)
GFR, Estimated: >60 mL/min (ref >60)
Glucose, Bld: 139 mg/dL — ABNORMAL HIGH (ref 70–99)
Potassium: 3.7 mmol/L (ref 3.5–5.1)
Sodium: 137 mmol/L (ref 135–145)

## 2021-05-10 LAB — CBC
HCT: 43.8 % (ref 36.0–46.0)
Hemoglobin: 14.5 g/dL (ref 12.0–15.0)
MCH: 29.1 pg (ref 26.0–34.0)
MCHC: 33.1 g/dL (ref 30.0–36.0)
MCV: 87.8 fL (ref 80.0–100.0)
Platelets: 319 10*3/uL (ref 150–400)
RBC: 4.99 MIL/uL (ref 3.87–5.11)
RDW: 13.6 % (ref 11.5–15.5)
WBC: 6 10*3/uL (ref 4.0–10.5)
nRBC: 0 % (ref 0.0–0.2)

## 2021-05-10 LAB — ECHOCARDIOGRAM COMPLETE BUBBLE STUDY
Area-P 1/2: 3.46 cm2
S' Lateral: 2.9 cm

## 2021-05-10 LAB — I-STAT CHEM 8, ED
BUN: 12 mg/dL (ref 6–20)
Calcium, Ion: 1.17 mmol/L (ref 1.15–1.40)
Chloride: 108 mmol/L (ref 98–111)
Creatinine, Ser: 0.6 mg/dL (ref 0.44–1.00)
Glucose, Bld: 97 mg/dL (ref 70–99)
HCT: 43 % (ref 36.0–46.0)
Hemoglobin: 14.6 g/dL (ref 12.0–15.0)
Potassium: 3.9 mmol/L (ref 3.5–5.1)
Sodium: 140 mmol/L (ref 135–145)
TCO2: 21 mmol/L — ABNORMAL LOW (ref 22–32)

## 2021-05-10 LAB — HEMOGLOBIN A1C
Hgb A1c MFr Bld: 5.6 % (ref 4.8–5.6)
Mean Plasma Glucose: 114.02 mg/dL

## 2021-05-10 LAB — I-STAT BETA HCG BLOOD, ED (MC, WL, AP ONLY): I-stat hCG, quantitative: <5 m[IU]/mL (ref <5)

## 2021-05-10 LAB — LIPID PANEL
Cholesterol: 207 mg/dL — ABNORMAL HIGH (ref 0–200)
HDL: 58 mg/dL (ref >40)
LDL Cholesterol: 126 mg/dL — ABNORMAL HIGH (ref 0–99)
Total CHOL/HDL Ratio: 3.6 RATIO
Triglycerides: 114 mg/dL (ref <150)
VLDL: 23 mg/dL (ref 0–40)

## 2021-05-10 LAB — HIV ANTIBODY (ROUTINE TESTING W REFLEX): HIV Screen 4th Generation wRfx: NONREACTIVE

## 2021-05-10 MED ORDER — ASPIRIN 81 MG PO TBEC: 81.0000 mg | DELAYED_RELEASE_TABLET | Freq: Every day | ORAL | 0 refills | Status: DC

## 2021-05-10 MED ORDER — ATORVASTATIN CALCIUM 40 MG PO TABS
40.0000 mg | ORAL_TABLET | Freq: Every day | ORAL | Status: DC
  Administered 2021-05-10: 40 mg via ORAL
  Filled 2021-05-10: qty 1

## 2021-05-10 MED ORDER — ASPIRIN 300 MG RE SUPP: 300.0000 mg | Freq: Every day | RECTAL | Status: DC

## 2021-05-10 MED ORDER — CYCLOBENZAPRINE HCL 10 MG PO TABS
5.0000 mg | ORAL_TABLET | Freq: Three times a day (TID) | ORAL | Status: DC
  Administered 2021-05-10 (×2): 5 mg via ORAL
  Filled 2021-05-10 (×2): qty 1

## 2021-05-10 MED ORDER — ASPIRIN 325 MG PO TABS
325.0000 mg | ORAL_TABLET | Freq: Every day | ORAL | Status: DC
  Administered 2021-05-10: 325 mg via ORAL
  Filled 2021-05-10 (×2): qty 1

## 2021-05-10 MED ORDER — CLOPIDOGREL BISULFATE 75 MG PO TABS: 75.0000 mg | ORAL_TABLET | Freq: Every day | ORAL | 0 refills | Status: DC

## 2021-05-10 MED ORDER — CLOPIDOGREL BISULFATE 75 MG PO TABS
75.0000 mg | ORAL_TABLET | Freq: Every day | ORAL | Status: DC
  Administered 2021-05-10: 75 mg via ORAL
  Filled 2021-05-10: qty 1

## 2021-05-10 MED ORDER — ASPIRIN EC 81 MG PO TBEC
81.0000 mg | DELAYED_RELEASE_TABLET | Freq: Every day | ORAL | Status: DC
  Filled 2021-05-10: qty 1

## 2021-05-10 MED ORDER — ATORVASTATIN CALCIUM 40 MG PO TABS: 40.0000 mg | ORAL_TABLET | Freq: Every day | ORAL | 0 refills | Status: DC

## 2021-05-10 NOTE — ED Notes (Signed)
Pt was given d/c instructions by the providers at bedside, this RN answered any questions concerning her AVS paperwork before she left.

## 2021-05-10 NOTE — Consult Note (Signed)
NEUROLOGY CONSULTATION NOTE   Date of service: May 10, 2021 Patient Name: Abigail Porter MRN:  [id number] DOB:  03-02-1976 Reason for consult: "pontine stroke" Requesting Provider: Carney Living, MD _ _ _   _ __   _ __ _ _  __ __   _ __   __ _  History of Present Illness  Abigail Porter is a 45 y.o. female with PMH significant for hypertension, GERD, who presents with dizziness, numbness in left hand.  Patient reports that her symptoms started on 05/08/2021 between 11 AM and noon.  She reports that she was cooking and out of nowhere felt dizzy and her left fingers went completely numb.  The dizziness was persistent and worse every time she would look to her right so she eventually decided to come to the emergency department for further evaluation and work-up.  She feels that the dizziness is better today but she still feels dizzy when she stands up.  She had work-up in the ED with an MRI brain without contrast which demonstrated a Right  pontine stroke that appears to be a small vessel stroke.  In addition, also notable for a prior hemorrhage vs cavernous malformation in the right pons.  She does not smoke, does not have a history of diabetes, does not take aspirin at home, reports her blood pressure at home typically runs between 150s to 160s systolic and she checks it very occasionally.  In terms of family history of stroke, her grandmother had a stroke but no other family members that she remembers.  MR S: 0 TNKase/thrombectomy: Not a candidate due to outside of window. Last known well: 05/08/2021 at 11 AM.  NIHSS components Score: Comment  1a Level of Conscious 0[x]  1[]  2[]  3[]      1b LOC Questions 0[x]  1[]  2[]       1c LOC Commands 0[x]  1[]  2[]       2 Best Gaze 0[x]  1[]  2[]       3 Visual 0[x]  1[]  2[]  3[]      4 Facial Palsy 0[x]  1[]  2[]  3[]      5a Motor Arm - left 0[x]  1[]  2[]  3[]  4[]  UN[]    5b Motor Arm - Right 0[x]  1[]  2[]  3[]  4[]  UN[]    6a Motor Leg - Left 0[x]  1[]  2[]  3[]   4[]  UN[]    6b Motor Leg - Right 0[x]  1[]  2[]  3[]  4[]  UN[]    7 Limb Ataxia 0[x]  1[]  2[]  3[]  UN[]     8 Sensory 0[]  1[x]  2[]  UN[]      9 Best Language 0[x]  1[]  2[]  3[]      10 Dysarthria 0[x]  1[]  2[]  UN[]      11 Extinct. and Inattention 0[]  1[]  2[]       TOTAL: 1      ROS   Constitutional Denies weight loss, fever and chills.   HEENT Denies changes in vision and hearing.   Respiratory Denies SOB and cough.   CV Denies palpitations and CP   GI Denies abdominal pain, nausea, vomiting and diarrhea.   GU Denies dysuria and urinary frequency.   MSK Denies myalgia and joint pain.   Skin Denies rash and pruritus.   Neurological Denies headache and syncope.   Psychiatric Denies recent changes in mood. Denies anxiety and depression.    Past History   Past Medical History:  Diagnosis Date  . GERD (gastroesophageal reflux disease) 10/12/2017  . HTN (hypertension) 10/12/2017   Past Surgical History:  Procedure Laterality Date  . INTRAUTERINE DEVICE (IUD) INSERTION  Family History  Problem Relation Age of Onset  . Hypertension Mother    Social History   Socioeconomic History  . Marital status: Married    Spouse name: Not on file  . Number of children: Not on file  . Years of education: Not on file  . Highest education level: Not on file  Occupational History  . Not on file  Tobacco Use  . Smoking status: Never  . Smokeless tobacco: Never  Vaping Use  . Vaping Use: Never used  Substance and Sexual Activity  . Alcohol use: Never  . Drug use: Never  . Sexual activity: Not Currently    Birth control/protection: I.U.D.  Other Topics Concern  . Not on file  Social History Narrative  . Not on file   Social Determinants of Health   Financial Resource Strain: Not on file  Food Insecurity: Not on file  Transportation Needs: Not on file  Physical Activity: Not on file  Stress: Not on file  Social Connections: Not on file   No Known Allergies  Medications  (Not in a  hospital admission)    Vitals   Vitals:   05/09/21 1337 05/09/21 1532 05/09/21 2030 05/09/21 2235  BP: (!) 132/91 (!) 145/103 (!) 156/104 (!) 142/86  Pulse: 82 86 88 86  Resp: 18 15 16 16   Temp:      TempSrc:      SpO2: 98% 99% 98% 98%     There is no height or weight on file to calculate BMI.  Physical Exam   General: Laying comfortably in bed; in no acute distress.  HENT: Normal oropharynx and mucosa. Normal external appearance of ears and nose.  Neck: Supple, no pain or tenderness  CV: No JVD. No peripheral edema.  Pulmonary: Symmetric Chest rise. Normal respiratory effort.  Abdomen: Soft to touch, non-tender.  Ext: No cyanosis, edema, or deformity  Skin: No rash. Normal palpation of skin.   Musculoskeletal: Normal digits and nails by inspection. No clubbing.   Neurologic Examination  Mental status/Cognition: Alert, oriented to self, place, month and year, good attention.  Speech/language: Fluent, comprehension intact, object naming intact, repetition intact.  Cranial nerves:   CN II Pupils equal and reactive to light, no VF deficits    CN III,IV,VI EOM intact, no gaze preference or deviation, no nystagmus    CN V normal sensation in V1, V2, and V3 segments bilaterally    CN VII no asymmetry, no nasolabial fold flattening    CN VIII normal hearing to speech    CN IX & X normal palatal elevation, no uvular deviation    CN XI 5/5 head turn and 5/5 shoulder shrug bilaterally    CN XII midline tongue protrusion    Motor:  Muscle bulk: normal, tone normal, pronator drift none tremor none Mvmt Root Nerve  Muscle Right Left Comments  SA C5/6 Ax Deltoid 5 5   EF C5/6 Mc Biceps 5 5   EE C6/7/8 Rad Triceps 5 5   WF C6/7 Med FCR     WE C7/8 PIN ECU     F Ab C8/T1 U ADM/FDI 5 4+   HF L1/2/3 Fem Illopsoas 5 5   KE L2/3/4 Fem Quad 5 5   DF L4/5 D Peron Tib Ant 5 5   PF S1/2 Tibial Grc/Sol 5 5    Reflexes:  Right Left Comments  Pectoralis      Biceps (C5/6) 1 1    Brachioradialis (C5/6) 2 2  Triceps (C6/7) 1 1    Patellar (L3/4) 2 2    Achilles (S1) 1 1    Hoffman      Plantar withdraws withdraws   Jaw jerk    Sensation:  Light touch Decreased in L hand and fingers.   Pin prick    Temperature    Vibration   Proprioception    Coordination/Complex Motor:  - Finger to Nose intact BL - Heel to shin intact BL - Rapid alternating movement are slowed in LUE - Gait: Stride length normal. Arm swing none. Base width narrow.  Labs   CBC:  Recent Labs  Lab 05/09/21 1031  WBC 5.9  NEUTROABS 3.4  HGB 14.1  HCT 42.3  MCV 87.6  PLT 344    Basic Metabolic Panel:  Lab Results  Component Value Date   NA 137 05/09/2021   K 3.9 05/09/2021   CO2 20 (L) 05/09/2021   GLUCOSE 95 05/09/2021   BUN 12 05/09/2021   CREATININE 0.69 05/09/2021   CALCIUM 9.0 05/09/2021   GFRNONAA >60 05/09/2021   Lipid Panel:  Lab Results  Component Value Date   LDLCALC 110 (H) 11/16/2017   HgbA1c: No results found for: HGBA1C Urine Drug Screen:     Component Value Date/Time   LABOPIA NONE DETECTED 05/09/2021 2150   COCAINSCRNUR NONE DETECTED 05/09/2021 2150   LABBENZ NONE DETECTED 05/09/2021 2150   AMPHETMU NONE DETECTED 05/09/2021 2150   THCU NONE DETECTED 05/09/2021 2150   LABBARB NONE DETECTED 05/09/2021 2150    Alcohol Level     Component Value Date/Time   ETH <10 05/09/2021 1029    CT Head without contrast: Personally reviewed and CTH was negative for a large hypodensity concerning for a large territory infarct or hyperdensity concerning for an ICH  MR Angio head without contrast and Carotid Duplex BL: Personally reviewed and no obvious LVO on MR angio head. Carotid duplex US is pending.  MRI Brain: R pontine stroke and prior R pontine SWI focus which is probably a prior microhemorrhage or cavernous malformation.  Impression   Reagann Dolce is a 45 y.o. female with PMH significant for ypertension, GERD, who presents with dizziness,  numbness in left hand.  Found to have a right pontine stroke. Her neurologic examination is notable for mild left hand weakness with numbness with a NIH stroke scale of 1.  I suspect that this stroke is probably due to small vessel disease and the likely cause of that is probably her hypertension.  But her high blood pressure does not seem to be that out of control and she is too young for strokes.  Primary Diagnosis:  Other cerebral infarction due to occlusion of stenosis of small artery.  Secondary Diagnosis: Essential (primary) hypertension  Recommendations  Plan:  - Frequent Neuro checks per stroke unit protocol - Recommend brain imaging with MRI Brain without contrast - Recommend Vascular imaging with MRA Angio Head without contrast and US Carotid doppler - Recommend obtaining TTE with bubble study(ordered) - Recommend obtaining Lipid panel with LDL - Please start statin if LDL > 70 - Recommend HbA1c - Antithrombotic - aspirin 81mg  daily. - Recommend DVT ppx - SBP goal - permissive hypertension first 24 h < 220/110. Held home meds.  - Recommend Telemetry monitoring for arrythmia - Recommend bedside swallow screen prior to PO intake. - Stroke education booklet - Recommend PT/OT/SLP consult  ______________________________________________________________________  Plan discussed with Dr. with the [facility] teaching service team over secure chat  Thank  you for the opportunity to take part in the care of this patient. If you have any further questions, please contact the neurology consultation attending.  Signed,  Erick Blinks [facility] Pager Number [phone] _ _ _   _ __   _ __ _ _  __ __   _ __   __ _

## 2021-05-10 NOTE — Progress Notes (Signed)
Family Medicine Teaching Service Daily Progress Note Intern Pager: [phone]  Patient name: Abigail Porter Medical record number: [id number] Date of birth: December 24, 1975 Age: 45 y.o. Gender: female  Primary Care Provider: Pcp, No Consultants: Neurology Code Status: Full  Pt Overview and Major Events to Date:  05/09/2021-admitted  Assessment and Plan: Abigail Porter is a 45 year old female with past medical history significant for hypertension, OSA and GERD admitted with left hand numbness and dizziness found to have acute right pontine CVA  Acute right pontine CVA  ischemic stroke (no tPA) Patient symptoms of left hand numbness and dizziness initially began on the early afternoon of 05/08/2021.MR brain from 05/09/2021 showed an acute right pontine stroke with concern for remote hemorrhage.  Was also notable for prior hemorrhage versus cavernous malformation of the right pons.  Patient had recently started on new blood pressure medication on 05/06/2021.  It is possible that her stroke is secondary to hypertension however, primary diagnosis per neurology is other cerebral infarction due to occlusion of stenosis of small artery. -Neurology consulted -Neurochecks every 2 hours -Carotid Dopplers  -TTE with bubble study -Aspirin 81 mg daily -Plavix 75 mg` -PT/OT eval and treat -NPO until passes speech eval -Permissive hypertension per neurology -IV labetalol and notify MD/DO for BPs greater than 220/120  Hypertension BP today is 133/95.  Home medications include losartan 50 mg started on 05/06/2021.  Patient was previously on lisinopril-HCTZ -Hold home medication -Permissive hypertension < 220/<110  Hyperlipidemia Patient does not have history of HLD and has not been on statin previously.  Lipid panel from this morning shows a total cholesterol of 207 and LDL of 126 -Start Atorvastatin 40 mg daily   GERD Home medication includes omeprazole 40 mg -Protonix 40 mg  FEN/GI: Regular diet PPx:  SCDs Dispo:Home today.   Subjective:  Pt states her numbness has spread from just her fingers of her left hand to the whole hand. States the dizziness has improved.  She is asking to go home today.   Objective: Temp:  [98 F (36.7 C)-98.4 F (36.9 C)] 98 F (36.7 C) (10/17 0403) Pulse Rate:  [68-90] 68 (10/17 0403) Resp:  [15-20] 20 (10/17 0403) BP: (132-156)/(86-104) 142/101 (10/17 0403) SpO2:  [98 %-100 %] 100 % (10/17 0403) Physical Exam: General: 45 y.o. female lying comfortably in bed, NAD Cardiovascular: well perfused Respiratory: breathing comfortably on room air, normal WOB Extremities: 5/5 muscle strength of bilateral UEs and LEs Neuro: CN II-XII intact, sensation intact with exception of numbness of left hand from the fingers to wrist   Laboratory: Recent Labs  Lab 05/09/21 1031 05/10/21 0400  WBC 5.9 6.0  HGB 14.1 14.5  HCT 42.3 43.8  PLT 344 319   Recent Labs  Lab 05/09/21 1029  NA 137  K 3.9  CL 108  CO2 20*  BUN 12  CREATININE 0.69  CALCIUM 9.0  PROT 7.2  BILITOT 0.7  ALKPHOS 40  ALT 16  AST 16  GLUCOSE 95     Erick Alley, DO 05/10/2021, 4:55 AM PGY-1, [facility] Family Medicine FPTS Intern pager: [phone], text pages welcome

## 2021-05-10 NOTE — Discharge Instructions (Addendum)
Thank you for letting us care for you during your stay. You were admitted to the [facility] Medicine Teaching Service for dizziness and numbness in your left hand.  You were found to have an acute stroke.  An ultrasound was done of your heart and of the blood vessels in your neck to look for a possible source of the stroke but these tests did not reveal a source.  It is possible that your high blood pressure could have played a role in causing the stroke.  Please continue to take your home blood pressure medication, Losartan 25 mg daily.   You were started on 3 new medications. To help prevent your blood from forming clots, you were started on Plavix 75 mg daily and Aspirin 81 mg daily.  Please take both the Plavix and aspirin for 3 weeks.  After 3 weeks, stop taking the Plavix but continue taking the aspirin. You were also started on a new medication called Atorvastatin 40 mg daily.  This medication is to help lower your high cholesterol.  Please follow-up with neurology in 4 weeks for a repeat MRI of your head.  They will call you to schedule an appointment. Please follow-up with your primary care physician within 1 week of discharge.  If your develop new concerns, please contact your primary care doctor or return to the hospital.  Please let us know if you have questions about your stay at [facility].

## 2021-05-10 NOTE — Progress Notes (Signed)
VASCULAR LAB    Carotid duplex has been performed.  See CV proc for preliminary results.   Idriss Quackenbush, RVT 05/10/2021, 10:57 AM

## 2021-05-10 NOTE — ED Notes (Signed)
Pt to vascular.

## 2021-05-10 NOTE — Progress Notes (Addendum)
STROKE TEAM PROGRESS NOTE   ATTENDING NOTE: I reviewed above note and agree with the assessment and plan. Pt was seen and examined.   45 year old female with history of hypertension admitted for dizziness, left hand numbness.  CT no acute anxiety.  MRI showed right pontine infarct, however at the same location could be prior hemorrhage versus hemorrhagic transformation versus cavernoma.  MRA, carotid Doppler and 2D echo unremarkable.  LDL 126, A1c 5.6, UDS negative.  Creatinine was 0.81.  awake, alert, eyes open, orientated to age, place, time and people. No aphasia, fluent language, following all simple commands. Able to name and repeat. No gaze palsy, tracking bilaterally, visual field full, PERRL. No facial droop. Tongue midline. Bilateral UEs 5/5, no drift. Bilaterally LEs 5/5, no drift. Sensation symmetrical bilaterally except decreased light touch sensation at left hand, b/l FTN intact, gait not tested.   Etiology for patient stroke likely small vessel disease.  However it is still unusual for this young patient with minimal stroke risk factors.  The signal at the same area of her DWI change concerning for prior hemorrhage or small hemorrhagic transformation or cavernous malformation.  Will need repeat MRI with and without contrast in 4 weeks at outpatient neurology follow-up.  Continue DAPT for 3 weeks and then aspirin alone.  Continue Lipitor 40.  For detailed assessment and plan, please refer to above as I have made changes wherever appropriate.   Neurology will sign off. Please call with questions. Pt will follow up with stroke clinic Dr. Pearlean Brownie at [facility] in about 4 weeks. Thanks for the consult.   Marvel Plan, MD PhD Stroke Neurology 05/10/2021 6:19 PM    INTERVAL HISTORY Patient reports that dizziness has improved by 80%, however, she continues to report decreased sensation in a glove like distribution of the left hand with numbness distally in fingers and thumb.     Patient states  that she does monitor her blood pressure at home and that her SBP is usually in the 140s-150s. She reports compliance with home medications and no barriers to obtaining them.  She does not engage in regular physical exercise as she is too tired after coming home from work (patient reports walking around a lot for work).  Patient does not have a PCP at this point but is willing to establish a relationship with one. We discussed her stroke diagnosis, work up, plan of care and her questions were answered.   Vitals:   05/10/21 0834 05/10/21 1215 05/10/21 1216 05/10/21 1300  BP: (!) 133/95 (!) 148/102 (!) 148/102 127/90  Pulse: 74 82 84 79  Resp: 18 19  19   Temp:      TempSrc:      SpO2: 96% 98% 96% 99%   CBC:  Recent Labs  Lab 05/09/21 1031 05/09/21 1039 05/10/21 0400  WBC 5.9  --  6.0  NEUTROABS 3.4  --   --   HGB 14.1 14.6 14.5  HCT 42.3 43.0 43.8  MCV 87.6  --  87.8  PLT 344  --  319   Basic Metabolic Panel:  Recent Labs  Lab 05/09/21 1029 05/09/21 1039 05/10/21 0400  NA 137 140 137  K 3.9 3.9 3.7  CL 108 108 107  CO2 20*  --  22  GLUCOSE 95 97 139*  BUN 12 12 12   CREATININE 0.69 0.60 0.81  CALCIUM 9.0  --  9.2   Lipid Panel:  Recent Labs  Lab 05/10/21 0400  CHOL 207*  TRIG 114  HDL 58  CHOLHDL 3.6  VLDL 23  LDLCALC 802*   HgbA1c:  Recent Labs  Lab 05/10/21 0400  HGBA1C 5.6   Urine Drug Screen:  Recent Labs  Lab 05/09/21 2150  LABOPIA NONE DETECTED  COCAINSCRNUR NONE DETECTED  LABBENZ NONE DETECTED  AMPHETMU NONE DETECTED  THCU NONE DETECTED  LABBARB NONE DETECTED    Alcohol Level  Recent Labs  Lab 05/09/21 1029  ETH <10    IMAGING past 24 hours MR ANGIO HEAD WO CONTRAST  Result Date: 05/10/2021 CLINICAL DATA:  Stroke follow-up EXAM: MRA HEAD WITHOUT CONTRAST TECHNIQUE: Angiographic images of the Circle of Willis were acquired using MRA technique without intravenous contrast. COMPARISON:  No pertinent prior exam. FINDINGS: POSTERIOR  CIRCULATION: --Vertebral arteries: Normal --Inferior cerebellar arteries: Normal. --Basilar artery: Normal. --Superior cerebellar arteries: Normal. --Posterior cerebral arteries: Normal. ANTERIOR CIRCULATION: --Intracranial internal carotid arteries: Normal. --Anterior cerebral arteries (ACA): Normal. --Middle cerebral arteries (MCA): Normal. ANATOMIC VARIANTS: None IMPRESSION: Normal intracranial MRA. Electronically Signed   By: Deatra Robinson M.D.   On: 05/10/2021 00:01   MR BRAIN WO CONTRAST  Result Date: 05/09/2021 CLINICAL DATA:  Dizziness, non-specific EXAM: MRI HEAD WITHOUT CONTRAST TECHNIQUE: Multiplanar, multiecho pulse sequences of the brain and surrounding structures were obtained without intravenous contrast. COMPARISON:  Same day CT head. FINDINGS: Brain: Restricted diffusion in the right pons, compatible with acute infarct. Mild edema without mass effect. Focus susceptibility artifact in the right pons in the region of infarct with faint T2 hypointensity (no hyperdensity on same day CT head). No hydrocephalus, midline shift, or extra-axial fluid collection. Mild additional scattered T2 hyperintensities throughout the white matter, nonspecific but compatible with chronic microvascular ischemic disease. Vascular: Major arterial flow voids are maintained at the skull base. Skull and upper cervical spine: Normal marrow signal. Sinuses/Orbits: Clear sinuses.  Unremarkable orbits. Other: No mastoid effusions. IMPRESSION: 1. Acute right pontine infarct.  Mild edema without mass effect. 2. 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 Acute hemorrhage is thought unlikely given no hyperdensity on same day CT head. 3. Mild chronic microvascular ischemic disease Electronically Signed   By: Feliberto Harts M.D.   On: 05/09/2021 16:51   ECHOCARDIOGRAM COMPLETE BUBBLE STUDY  Result Date: 05/10/2021    ECHOCARDIOGRAM  REPORT   Patient Name:   TYLEAH WRIDE Date of Exam: 05/10/2021 Medical Rec #:  [id number]   Height:       61.0 in Accession #:    [id number]  Weight:       142.7 lb Date of Birth:  11-17-1975   BSA:          1.637 m Patient Age:    45 years    BP:           133/95 mmHg Patient Gender: F           HR:           84 bpm. Exam Location:  Inpatient Procedure: 2D Echo, Color Doppler, Cardiac Doppler and Saline Contrast Bubble            Study Indications:    stroke  History:        Patient has no prior history of Echocardiogram examinations.                 Risk Factors:Hypertension.  Sonographer:    Trellis Moment RDCS (AE, PE) Referring Phys: [id number] [facility] IMPRESSIONS  1. Left ventricular  ejection fraction, by estimation, is 60 to 65%. The left ventricle has normal function. The left ventricle has no regional wall motion abnormalities. Left ventricular diastolic parameters are consistent with Grade II diastolic dysfunction (pseudonormalization).  2. Right ventricular systolic function is normal. The right ventricular size is normal.  3. Right atrial size was mildly dilated.  4. The mitral valve is normal in structure. No evidence of mitral valve regurgitation. No evidence of mitral stenosis.  5. The aortic valve is normal in structure. Aortic valve regurgitation is trivial. No aortic stenosis is present.  6. The inferior vena cava is normal in size with greater than 50% respiratory variability, suggesting right atrial pressure of 3 mmHg.  7. Agitated saline contrast bubble study was negative, with no evidence of any interatrial shunt. Conclusion(s)/Recommendation(s): No intracardiac source of embolism detected on this transthoracic study. A transesophageal echocardiogram is recommended to exclude cardiac source of embolism if clinically indicated. FINDINGS  Left Ventricle: Left ventricular ejection fraction, by estimation, is 60 to 65%. The left ventricle has normal function. The left ventricle has no  regional wall motion abnormalities. The left ventricular internal cavity size was normal in size. There is  no left ventricular hypertrophy. Left ventricular diastolic parameters are consistent with Grade II diastolic dysfunction (pseudonormalization). Right Ventricle: The right ventricular size is normal. No increase in right ventricular wall thickness. Right ventricular systolic function is normal. Left Atrium: Left atrial size was normal in size. Right Atrium: Right atrial size was mildly dilated. Pericardium: There is no evidence of pericardial effusion. Mitral Valve: The mitral valve is normal in structure. No evidence of mitral valve regurgitation. No evidence of mitral valve stenosis. Tricuspid Valve: The tricuspid valve is normal in structure. Tricuspid valve regurgitation is not demonstrated. No evidence of tricuspid stenosis. Aortic Valve: The aortic valve is normal in structure. Aortic valve regurgitation is trivial. No aortic stenosis is present. Pulmonic Valve: The pulmonic valve was normal in structure. Pulmonic valve regurgitation is not visualized. No evidence of pulmonic stenosis. Aorta: The aortic root is normal in size and structure. Venous: The inferior vena cava is normal in size with greater than 50% respiratory variability, suggesting right atrial pressure of 3 mmHg. IAS/Shunts: No atrial level shunt detected by color flow Doppler. Agitated saline contrast was given intravenously to evaluate for intracardiac shunting. Agitated saline contrast bubble study was negative, with no evidence of any interatrial shunt.  LEFT VENTRICLE PLAX 2D LVIDd:         4.00 cm   Diastology LVIDs:         2.90 cm   LV e' medial:    6.40 cm/s LV PW:         0.90 cm   LV E/e' medial:  10.1 LV IVS:        1.00 cm   LV e' lateral:   9.11 cm/s LVOT diam:     2.00 cm   LV E/e' lateral: 7.1 LV SV:         52 LV SV Index:   31 LVOT Area:     3.14 cm  RIGHT VENTRICLE TAPSE (M-mode): 1.3 cm LEFT ATRIUM             Index         RIGHT ATRIUM           Index LA diam:        3.10 cm 1.89 cm/m   RA Area:     15.70 cm LA Vol (A2C):  16.6 ml 10.14 ml/m  RA Volume:   46.40 ml  28.35 ml/m LA Vol (A4C):   14.4 ml 8.80 ml/m LA Biplane Vol: 17.0 ml 10.39 ml/m  AORTIC VALVE LVOT Vmax:   84.20 cm/s LVOT Vmean:  60.500 cm/s LVOT VTI:    0.164 m  AORTA Ao Asc diam: 2.80 cm MITRAL VALVE MV Area (PHT): 3.46 cm    SHUNTS MV Decel Time: 219 msec    Systemic VTI:  0.16 m MV E velocity: 64.90 cm/s  Systemic Diam: 2.00 cm MV A velocity: 53.40 cm/s MV E/A ratio:  1.22 Donato Schultz MD Electronically signed by Donato Schultz MD Signature Date/Time: 05/10/2021/1:57:01 PM    Final    VAS US CAROTID (at [facility] and [facility] only)  Result Date: 05/10/2021 Carotid Arterial Duplex Study Patient Name:  BEKKI TAVENNER  Date of Exam:   05/10/2021 Medical Rec #: [id number]    Accession #:    [id number] Date of Birth: 1975-12-10    Patient Gender: F Patient Age:   56 years Exam Location:  [facility] Procedure:      VAS US CAROTID Referring Phys: DAN FLOYD --------------------------------------------------------------------------------  Indications:       CVA, Numbness and Dizziness, headache. Risk Factors:      Hypertension. Comparison Study:  No prior study Performing Technologist: Sherren Kerns RVS  Examination Guidelines: A complete evaluation includes B-mode imaging, spectral Doppler, color Doppler, and power Doppler as needed of all accessible portions of each vessel. Bilateral testing is considered an integral part of a complete examination. Limited examinations for reoccurring indications may be performed as noted.  Right Carotid Findings: +----------+--------+--------+--------+------------------+------------------+           PSV cm/sEDV cm/sStenosisPlaque DescriptionComments           +----------+--------+--------+--------+------------------+------------------+ CCA Prox  104     13                                intimal thickening  +----------+--------+--------+--------+------------------+------------------+ CCA Distal90      25                                intimal thickening +----------+--------+--------+--------+------------------+------------------+ ICA Prox  51      20                                                   +----------+--------+--------+--------+------------------+------------------+ ICA Distal62      33                                                   +----------+--------+--------+--------+------------------+------------------+ ECA       61      11                                                   +----------+--------+--------+--------+------------------+------------------+ +----------+--------+-------+--------+-------------------+           PSV cm/sEDV cmsDescribeArm Pressure (mmHG) +----------+--------+-------+--------+-------------------+ Subclavian180                                        +----------+--------+-------+--------+-------------------+ +---------+--------+--+--------+--+  VertebralPSV cm/s39EDV cm/s15 +---------+--------+--+--------+--+  Left Carotid Findings: +----------+--------+--------+--------+------------------+------------------+           PSV cm/sEDV cm/sStenosisPlaque DescriptionComments           +----------+--------+--------+--------+------------------+------------------+ CCA Prox  106     28                                intimal thickening +----------+--------+--------+--------+------------------+------------------+ CCA Distal104     30                                intimal thickening +----------+--------+--------+--------+------------------+------------------+ ICA Prox  58      28              heterogenous                         +----------+--------+--------+--------+------------------+------------------+ ICA Distal82      39                                                    +----------+--------+--------+--------+------------------+------------------+ ECA       63      17                                                   +----------+--------+--------+--------+------------------+------------------+ +----------+--------+--------+--------+-------------------+           PSV cm/sEDV cm/sDescribeArm Pressure (mmHG) +----------+--------+--------+--------+-------------------+ [id number]                                          +----------+--------+--------+--------+-------------------+ +---------+--------+--+--------+--+ VertebralPSV cm/s34EDV cm/s12 +---------+--------+--+--------+--+   Summary: Right Carotid: The extracranial vessels were near-normal with only minimal wall                thickening or plaque. Left Carotid: The extracranial vessels were near-normal with only minimal wall               thickening or plaque. Vertebrals:  Bilateral vertebral arteries demonstrate antegrade flow. Subclavians: Normal flow hemodynamics were seen in bilateral subclavian              arteries. *See table(s) above for measurements and observations.     Preliminary     PHYSICAL EXAM  Temp:  [98 F (36.7 C)] 98 F (36.7 C) (10/17 0403) Pulse Rate:  [68-88] 79 (10/17 1300) Resp:  [16-20] 19 (10/17 1300) BP: (127-156)/(86-104) 127/90 (10/17 1300) SpO2:  [96 %-100 %] 99 % (10/17 1300)  General - Well nourished, well developed, sitting up on stretcher in no apparent distress.  Ophthalmologic - fundi not visualized due to noncooperation.  Cardiovascular - Regular rhythm and rate.  Mental Status -  Level of arousal and orientation to time, place, and person were intact. Language including expression, naming, repetition, comprehension was assessed and found intact. Attention span and concentration were normal. Recent and remote memory were intact. Fund of Knowledge was assessed and was intact.  Cranial Nerves II - XII - II - Visual field intact OU. III, IV,  VI - Extraocular movements intact. V - Facial sensation intact bilaterally. VII - Facial movement intact bilaterally. VIII - Hearing & vestibular intact bilaterally. X - Palate elevates symmetrically. XI - Chin turning & shoulder shrug intact bilaterally. XII - Tongue protrusion intact.  Motor Strength - The patient's strength was normal in all extremities and pronator drift was absent.  Bulk was normal and fasciculations were absent.   Motor Tone - Muscle tone was assessed at the neck and appendages and was normal.   Sensory - Light touch was impaired in her left hand in a glove like distribution worsening proximally with complete numbness reported in fingers and thumb.   Coordination - The patient had normal movements in the hands and feet with no ataxia or dysmetria.  Tremor was absent.  Gait and Station - deferred.   ASSESSMENT/PLAN Pristine Gladhill is a 45 y.o. female with PMH significant for hypertension, GERD, who presents with dizziness, numbness in left hand. Patient reports that her symptoms started on 05/08/2021 between 11 AM and noon.  She reports that she was cooking and out of nowhere felt dizzy and her left fingers went completely numb.  The dizziness was persistent and worse every time she would look to her right so she eventually decided to come to the emergency department for further evaluation and work-up.  She feels that the dizziness is better today but she still feels dizzy when she stands up. She had work-up in the ED with an MRI brain without contrast which demonstrated a Right  pontine stroke that appears to be a small vessel stroke.  In addition, also notable for a prior hemorrhage vs cavernous malformation in the right pons. She does not smoke, does not have a history of diabetes, does not take aspirin at home, reports her blood pressure at home typically runs between 150s to 160s systolic and she checks it very occasionally.  In terms of family history of stroke, her  grandmother had a stroke but no other family members that she remembers.  Stroke: Right pontine stroke with possible small hemorrhagic transformation or prior small pontine hemorrhage or small pontine cavernoma CT Head no acute finding  MR Angio head unremarkable MRI Brain: R pontine stroke and prior R pontine SWI focus which is probably a prior microhemorrhage or cavernous malformation. Carotid Doppler unremarkable  2D Echo EF 60 to 65% Patient need MRI with and without contrast repeat in 4 to 6 weeks during outpatient neurology follow-up. LDL 126 HgbA1c 5.6 VTE prophylaxis - recommend management per primary team Recommend DAPT with ASA 81mg  and Plavix 75mg  x 3 weeks then ASA monotherapy  Therapy recommendations: pending PT/OT evaluation Disposition:  TBD  Hypertension Stable Long-term BP goal normotensive Patient to establish care with a PCP  Hyperlipidemia Home meds:  none LDL 126, goal < 70 Add atorvastatin 40 mg daily Continue statin at discharge Establish care with PCP for ongoing stroke risk factor control  Other Stroke Risk Factors Family hx stroke (grandmother)   Hospital day # 1 Dr. directed the plan of care.  , NP-C   To contact Stroke Continuity provider, please refer to Roda Shutters. After hours, contact General Neurology

## 2021-05-11 NOTE — Discharge Summary (Addendum)
Family Medicine Teaching [facility] Discharge Summary  Patient name: Abigail Porter Medical record number: [id number] Date of birth: Apr 10, 1976 Age: 45 y.o. Gender: female Date of Admission: 05/09/2021  Date of Discharge: 05/10/2021 Admitting Physician: Katha Cabal, DO  Primary Care Provider: Pcp, No Consultants: Neurology  Indication for Hospitalization: Acute right pontine CVA  Discharge Diagnoses/Problem List:  Active Problems:   Right pontine stroke [facility])   Disposition: Home  Discharge Condition: Stable  Discharge Exam: Taken from Daily progress note General: 45 y.o. female lying comfortably in bed, NAD Cardiovascular: well perfused Respiratory: breathing comfortably on room air, normal WOB Extremities: 5/5 muscle strength of bilateral UEs and LEs Neuro: CN II-XII intact, sensation intact with exception of numbness of left hand from the fingers to wrist  Brief Hospital Course:  Abigail Porter is a 45 y.o. female presenting with one day history of left hand numbness and dizziness . PMH is significant for HTN, OA and GERD  Acute right Pontine CVA  Ischemic Stroke (No TPA) Patient presented to the hospital with 1 day of dizziness and numbness in her left hand with an exam significant for mild left-sided weakness.  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  MRA head was normal.  TTE with bubble study showed no intracardiac source of embolism but showed evidence of Grade II diastolic dysfunction, a mildly dilated right atrium, and trivial aortic regurgitation.  Carotid dopplers were unrevealing as cause of stroke. 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 Pt was started on Plavix 75 mg daily and aspirin 81 mg daily. Neurology recommends pt to continue DAPT for 3 weeks and then ASA monotherapy.      Hypertension BP on admission  156/104.  Home medication of losartan 50 mg was held initially for permissive hypertension in the first 24 hours.  Blood pressures remained stable prior to discharge.  Patient was discharged with instructions to resume home blood pressure medication.  Hyperlipidemia  Lipid panel on 05/10/21 showed total cholesterol of 207 and LDL of 126.  Pt was started on Atorvastatin 40 mg daily.   Issues for follow-up: Monitor blood pressure Ensure pt is taking new medications Ensure follow up with neurology in 4 weeks     Significant Labs and Imaging:  Recent Labs  Lab 05/09/21 1031 05/09/21 1039 05/10/21 0400  WBC 5.9  --  6.0  HGB 14.1 14.6 14.5  HCT 42.3 43.0 43.8  PLT 344  --  319   Recent Labs  Lab 05/09/21 1029 05/09/21 1039 05/10/21 0400  NA 137 140 137  K 3.9 3.9 3.7  CL 108 108 107  CO2 20*  --  22  GLUCOSE 95 97 139*  BUN 12 12 12   CREATININE 0.69 0.60 0.81  CALCIUM 9.0  --  9.2  ALKPHOS 40  --   --   AST 16  --   --   ALT 16  --   --   ALBUMIN 3.9  --   --       Discharge Medications:  Allergies as of 05/10/2021   No Known Allergies      Medication List     STOP taking these medications    diclofenac sodium 1 % Gel Commonly known as: VOLTAREN   lisinopril-hydrochlorothiazide 10-12.5 MG tablet Commonly known as: ZESTORETIC   meloxicam 15 MG tablet Commonly known as: MOBIC  TAKE these medications    aspirin 81 MG EC tablet Take 1 tablet (81 mg total) by mouth daily. Swallow whole.   atorvastatin 40 MG tablet Commonly known as: LIPITOR Take 1 tablet (40 mg total) by mouth daily.   clopidogrel 75 MG tablet Commonly known as: PLAVIX Take 1 tablet (75 mg total) by mouth daily.   cyclobenzaprine 5 MG tablet Commonly known as: FLEXERIL Take 5 mg by mouth See admin instructions. Tid x 10 days   losartan 25 MG tablet Commonly known as: COZAAR Take 25 mg by mouth daily.   naproxen sodium 220 MG tablet Commonly known as: ALEVE Take 220  mg by mouth daily as needed (headache).   omeprazole 40 MG capsule Commonly known as: PRILOSEC Take 40 mg by mouth every morning. What changed: Another medication with the same name was removed. Continue taking this medication, and follow the directions you see here.        Discharge Instructions: Please refer to Patient Instructions section of EMR for full details.  Patient was counseled important signs and symptoms that should prompt return to medical care, changes in medications, dietary instructions, activity restrictions, and follow up appointments.   Follow-Up Appointments:  Follow-up Information     Micki Riley, MD. Schedule an appointment as soon as possible for a visit in 1 month(s).   Specialties: Neurology, Radiology Why: stroke clinic Contact information: [address] [place] [address] [phone]                 Erick Alley, DO 05/11/2021, 5:16 AM PGY-1, [facility]   FPTS Upper-Level Resident Addendum   I have discussed the above with Dr. Yetta Barre and agree with the documentation. My edits for correction/addition/clarification are included above. Please see any attending notes.   Maury Dus, MD PGY-2, [facility] 05/11/2021 1:18 PM  FPTS Service pager: [phone] (text pages welcome through [employer])

## 2021-05-13 ENCOUNTER — Other Ambulatory Visit: Payer: Self-pay | Admitting: Nurse Practitioner

## 2021-05-13 DIAGNOSIS — Z1231 Encounter for screening mammogram for malignant neoplasm of breast: Secondary | ICD-10-CM

## 2021-06-08 ENCOUNTER — Encounter: Payer: Self-pay | Admitting: Neurology

## 2021-06-08 ENCOUNTER — Ambulatory Visit: Payer: 59 | Admitting: Neurology

## 2021-06-08 ENCOUNTER — Inpatient Hospital Stay: Payer: 59 | Admitting: Neurology

## 2021-06-08 VITALS — BP 148/104 | HR 94 | Ht 60.0 in | Wt 145.5 lb

## 2021-06-08 DIAGNOSIS — I10 Essential (primary) hypertension: Secondary | ICD-10-CM

## 2021-06-08 DIAGNOSIS — I6350 Cerebral infarction due to unspecified occlusion or stenosis of unspecified cerebral artery: Secondary | ICD-10-CM | POA: Diagnosis not present

## 2021-06-08 DIAGNOSIS — Q283 Other malformations of cerebral vessels: Secondary | ICD-10-CM | POA: Diagnosis not present

## 2021-06-08 MED ORDER — ATORVASTATIN CALCIUM 40 MG PO TABS: 40.0000 mg | ORAL_TABLET | Freq: Every day | ORAL | 4 refills | Status: AC

## 2021-06-08 MED ORDER — ASPIRIN 81 MG PO TBEC: 81.0000 mg | DELAYED_RELEASE_TABLET | Freq: Every day | ORAL | 5 refills | Status: AC

## 2021-06-08 NOTE — Patient Instructions (Addendum)
Discontinue Plavix and continue current medications  Referral to physical therapy for continue dizziness and left arm/hand weakness Return in 1 year

## 2021-06-08 NOTE — Progress Notes (Signed)
[facility]  PATIENT: Abigail Porter DOB: January 15, 1976  REQUESTING CLINICIAN: Marvel Plan, MD HISTORY FROM: Patient via interpretor REASON FOR VISIT: Right pontine stroke   HISTORICAL  CHIEF COMPLAINT:  Chief Complaint  Patient presents with   New Patient (Initial Visit)    Room 12. Pt alone (cone interpreter is present). Pt still having numbness in fingers, dizziness, feeling like she is going to faint. She states when she turns her head she'll start to feel dizzy. She also has pain down the side of her head to her neck.    Results    She has not been given results of the ECHO done in the hospital.     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  Etiology of stroke likely small vessel disease, she was started on dual antiplatelet platelet aspirin and Plavix for 3 weeks and to continue with aspirin thereafter.  The rest of her work-up was within normal limits.  Since discharge from the hospital, patient stated that she is improving, she still have intermittent dizziness but better than before, still have some left-sided weakness and tingling.  No falls.     OTHER MEDICAL CONDITIONS: HTN, HLD   REVIEW OF SYSTEMS: Full 14 system review of systems performed and negative with exception of: as noted in the HPI  ALLERGIES: No Known Allergies  HOME MEDICATIONS: Outpatient Medications Prior to Visit  Medication Sig Dispense Refill   cyclobenzaprine (FLEXERIL) 5 MG tablet Take 5 mg by mouth See admin instructions. Tid x 10 days     losartan (COZAAR) 25 MG tablet Take 25 mg by mouth daily.     naproxen sodium (ALEVE) 220 MG tablet Take 220 mg by mouth daily as needed (headache).     omeprazole (PRILOSEC)  40 MG capsule Take 40 mg by mouth every morning.     aspirin EC 81 MG EC tablet Take 1 tablet (81 mg total) by mouth daily. Swallow whole. 90 tablet 0   atorvastatin (LIPITOR) 40 MG tablet Take 1 tablet (40 mg total) by mouth daily. 90 tablet 0   clopidogrel (PLAVIX) 75 MG tablet Take 1 tablet (75 mg total) by mouth daily. 21 tablet 0   No facility-administered medications prior to visit.    PAST MEDICAL HISTORY: Past Medical History:  Diagnosis Date   GERD (gastroesophageal reflux disease) 10/12/2017   HTN (hypertension) 10/12/2017    PAST SURGICAL HISTORY: Past Surgical History:  Procedure Laterality Date   INTRAUTERINE DEVICE (IUD) INSERTION      FAMILY HISTORY: Family History  Problem Relation Age of Onset   Hypertension Mother     SOCIAL HISTORY: Social History   Socioeconomic History   Marital status: Married    Spouse name: Not on file   Number of children: Not on file   Years of education: Not on file   Highest education level: Not on file  Occupational History   Not on file  Tobacco Use   Smoking status: Never   Smokeless tobacco: Never  Vaping Use   Vaping Use: Never used  Substance and Sexual Activity   Alcohol use: Never   Drug use: Never   Sexual activity: Not Currently    Birth control/protection: I.U.D.  Other Topics Concern   Not  on file  Social History Narrative   Not on file   Social Determinants of Health   Financial Resource Strain: Not on file  Food Insecurity: Not on file  Transportation Needs: Not on file  Physical Activity: Not on file  Stress: Not on file  Social Connections: Not on file  Intimate Partner Violence: Not on file     PHYSICAL EXAM  GENERAL EXAM/CONSTITUTIONAL: Vitals:  Vitals:   06/08/21 1517  BP: (!) 148/104  Pulse: 94  Weight: 145 lb 8 oz (66 kg)  Height: 5' (1.524 m)   Body mass index is 28.42 kg/m. Wt Readings from Last 3 Encounters:  06/08/21 145 lb 8 oz (66 kg)  11/16/17 142 lb 12 oz (64.8 kg)   10/12/17 144 lb (65.3 kg)   Patient is in no distress; well developed, nourished and groomed; neck is supple  CARDIOVASCULAR: Examination of carotid arteries is normal; no carotid bruits Regular rate and rhythm, no murmurs Examination of peripheral vascular system by observation and palpation is normal  EYES: Pupils round and reactive to light, Visual fields full to confrontation, Extraocular movements intacts,   MUSCULOSKELETAL: Gait, strength, tone, movements noted in Neurologic exam below  NEUROLOGIC: MENTAL STATUS:  No flowsheet data found. awake, alert, oriented to person, place and time recent and remote memory intact normal attention and concentration language fluent, comprehension intact, naming intact fund of knowledge appropriate  CRANIAL NERVE:  2nd, 3rd, 4th, 6th - pupils equal and reactive to light, visual fields full to confrontation, extraocular muscles intact, no nystagmus 5th - facial sensation symmetric 7th - facial strength symmetric 8th - hearing intact 9th - palate elevates symmetrically, uvula midline 11th - shoulder shrug symmetric 12th - tongue protrusion midline  MOTOR:  normal bulk and tone, mild weakness in the LUE, 4 to 4+/5.   SENSORY:  normal and symmetric to light touch  COORDINATION:  finger-nose-finger, fine finger movements normal  REFLEXES:  deep tendon reflexes present and symmetric  GAIT/STATION:  normal   DIAGNOSTIC DATA (LABS, IMAGING, TESTING) - I reviewed patient records, labs, notes, testing and imaging myself where available.  Lab Results  Component Value Date   WBC 6.0 05/10/2021   HGB 14.5 05/10/2021   HCT 43.8 05/10/2021   MCV 87.8 05/10/2021   PLT 319 05/10/2021      Component Value Date/Time   NA 137 05/10/2021 0400   K 3.7 05/10/2021 0400   CL 107 05/10/2021 0400   CO2 22 05/10/2021 0400   GLUCOSE 139 (H) 05/10/2021 0400   BUN 12 05/10/2021 0400   CREATININE 0.81 05/10/2021 0400   CREATININE 0.74  11/16/2017 0927   CALCIUM 9.2 05/10/2021 0400   PROT 7.2 05/09/2021 1029   ALBUMIN 3.9 05/09/2021 1029   AST 16 05/09/2021 1029   ALT 16 05/09/2021 1029   ALKPHOS 40 05/09/2021 1029   BILITOT 0.7 05/09/2021 1029   GFRNONAA >60 05/10/2021 0400   Lab Results  Component Value Date   CHOL 207 (H) 05/10/2021   HDL 58 05/10/2021   LDLCALC 126 (H) 05/10/2021   TRIG 114 05/10/2021   CHOLHDL 3.6 05/10/2021   Lab Results  Component Value Date   HGBA1C 5.6 05/10/2021   No results found for: VITAMINB12 No results found for: TSH   MRI Brain/MRA head 1. Acute right pontine infarct.  Mild edema without mass effect. 2. 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 Acute hemorrhage  is thought unlikely given no hyperdensity on same day CT head. 3. Mild chronic microvascular ischemic disease   Echocardiogram  1. Left ventricular ejection fraction, by estimation, is 60 to 65%. The left ventricle has normal function. The left ventricle has no regional wall motion abnormalities. Left ventricular diastolic parameters are consistent with Grade II diastolic dysfunction (pseudonormalization).  2. Right ventricular systolic function is normal. The right ventricular size is normal.  3. Right atrial size was mildly dilated.  4. The mitral valve is normal in structure. No evidence of mitral valve regurgitation. No evidence of mitral stenosis.  5. The aortic valve is normal in structure. Aortic valve regurgitation is trivial. No aortic stenosis is present.  6. The inferior vena cava is normal in size with greater than 50% respiratory variability, suggesting right atrial pressure of 3 mmHg.  7. Agitated saline contrast bubble study was negative, with no evidence of any interatrial shunt.    ASSESSMENT AND PLAN  45 y.o. 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  Stroke etiology likely small vessel disease and patient was started on dual antiplatelet for 21 days and now she is on aspirin daily.  She is also on a statin.  She does not have any history of diabetes.  Discussed with patient lifestyle modification, need for exercise and medication adherence.  She is concerned about her cavernous malformation, it is located in the right pontine area, I informed patient that I will repeat the MRI in 1 year at her follow-up. She is also concerned of her left sided, left hand weakness and would like to do physical therapy, I informed patient that I will refer her to physical therapy for evaluation and possibly they will be able to work with her.  I will see her in 1 year for follow-up.   1. Right pontine stroke (HCC)   2. Cavernous malformation   3. Primary hypertension      PLAN: Discontinue Plavix and continue current medications  Referral to physical therapy for continue dizziness and left arm/hand weakness Return in 1 year  Orders Placed This Encounter  Procedures   Ambulatory referral to Physical Therapy    Meds ordered this encounter  Medications   aspirin 81 MG EC tablet    Sig: Take 1 tablet (81 mg total) by mouth daily. Swallow whole.    Dispense:  90 tablet    Refill:  5   atorvastatin (LIPITOR) 40 MG tablet    Sig: Take 1 tablet (40 mg total) by mouth daily.    Dispense:  90 tablet    Refill:  4    Return in about 1 year (around 06/08/2022).    Windell Norfolk, MD 06/08/2021, 9:27 PM  [facility] [address] [place], [place] [address] [phone]

## 2021-07-07 ENCOUNTER — Encounter: Payer: Self-pay | Admitting: Physical Therapy

## 2021-07-07 ENCOUNTER — Ambulatory Visit: Payer: 59 | Attending: Neurology | Admitting: Physical Therapy

## 2021-07-07 ENCOUNTER — Other Ambulatory Visit: Payer: Self-pay | Admitting: Neurology

## 2021-07-07 ENCOUNTER — Other Ambulatory Visit: Payer: Self-pay

## 2021-07-07 DIAGNOSIS — I6350 Cerebral infarction due to unspecified occlusion or stenosis of unspecified cerebral artery: Secondary | ICD-10-CM

## 2021-07-07 DIAGNOSIS — M6281 Muscle weakness (generalized): Secondary | ICD-10-CM | POA: Insufficient documentation

## 2021-07-07 DIAGNOSIS — R2681 Unsteadiness on feet: Secondary | ICD-10-CM | POA: Diagnosis present

## 2021-07-07 DIAGNOSIS — R42 Dizziness and giddiness: Secondary | ICD-10-CM | POA: Insufficient documentation

## 2021-07-07 NOTE — Therapy (Signed)
[facility] [address]. [place], [place], [address] Phone: [phone]   Fax:  [phone]  Physical Therapy Evaluation  Patient Details  Name: Abigail Porter MRN: [id number] Date of Birth: May 10, 1976 Referring Provider (PT): Dr. Teresa Coombs   Encounter Date: 07/07/2021   PT End of Session - 07/07/21 1755     Visit Number 1    Number of Visits 7    Date for PT Re-Evaluation 08/04/21    Authorization Type [employer]    Authorization Time Period 07/07/21 to 08/04/21    PT Start Time 1620    PT Stop Time 1659    PT Time Calculation (min) 39 min    Activity Tolerance Patient tolerated treatment well    Behavior During Therapy [facility] for tasks assessed/performed             Past Medical History:  Diagnosis Date   GERD (gastroesophageal reflux disease) 10/12/2017   HTN (hypertension) 10/12/2017    Past Surgical History:  Procedure Laterality Date   INTRAUTERINE DEVICE (IUD) INSERTION      There were no vitals filed for this visit.    Subjective Assessment - 07/07/21 1622     Subjective I had a stroke October 16th, I'm still feeling dizzy. I still am both dizzy and have numbness in my left fingers, but it is getting a little better than before. The leg strength is OK but my arm still feels weak. No double vision. The dizziness does not stop me from doing anything, when I get dizzy I usually recover pretty quickly. I do have some foot pain, it feels like I'm stepping on something on the right heel. Balance is OK.    Patient Stated Goals get rid of dizziness completely, get back to prior activities    Currently in Pain? No/denies                [facility] PT Assessment - 07/07/21 0001       Assessment   Medical Diagnosis pons CVA    Referring Provider (PT) Dr. Teresa Coombs    Onset Date/Surgical Date 05/09/21    Next MD Visit not sure    Prior Therapy no      Precautions   Precautions None      Restrictions   Weight Bearing  Restrictions No      Balance Screen   Has the patient fallen in the past 6 months No    Has the patient had a decrease in activity level because of a fear of falling?  No    Is the patient reluctant to leave their home because of a fear of falling?  No      Home Tourist information centre manager residence      Prior Function   Level of Independence Independent;Independent with basic ADLs    Vocation Full time employment    Leisure scans boxes in a factory      Observation/Other Assessments   Observations VOR testing appears WNL however description of sx does make it sound like VOR reflex might be involved- seems related to fatigue from her job      ROM / Strength   AROM / PROM / Strength Strength      Strength   Strength Assessment Site Hip;Knee;Ankle    Right/Left Hip Right;Left    Right Hip Flexion 4/5    Right Hip Extension 4/5    Right Hip ABduction 4/5    Left Hip Flexion  3/5    Left Hip Extension 4+/5    Left Hip ABduction 4/5    Right/Left Knee Right;Left    Right Knee Flexion 5/5    Right Knee Extension 5/5    Left Knee Flexion 4/5    Left Knee Extension 4/5    Right/Left Ankle Right;Left    Right Ankle Dorsiflexion 5/5    Left Ankle Dorsiflexion 4+/5      Standardized Balance Assessment   Standardized Balance Assessment Dynamic Gait Index      Dynamic Gait Index   Level Surface Normal    Change in Gait Speed Normal    Gait with Horizontal Head Turns Mild Impairment    Gait with Vertical Head Turns Mild Impairment    Gait and Pivot Turn Mild Impairment    Step Over Obstacle Normal    Step Around Obstacles Normal    Steps Normal    Total Score 21                        Objective measurements completed on examination: See above findings.                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; typical presentation and course of recovery post CVA, importance of PT/OT soon after CVA to maximize improved function after CVA    Person(s) Educated Patient    Methods Explanation    Comprehension Verbalized understanding              PT Short Term Goals - 07/07/21 1757       PT SHORT TERM GOAL #1   Title Will be compliant with appropriate progressive HEP    Time 1    Period Weeks    Status New    Target Date 07/14/21               PT Long Term Goals - 07/07/21 1757       PT LONG TERM GOAL #1   Title MMT to have improved by at least 1/2 grade in all weak groups in order to show improved functional strength    Time 3    Period Weeks    Status New    Target Date 07/28/21      PT LONG TERM GOAL #2   Title DGI to have improved to 24/24 to show improved functional balance    Time 3    Period Weeks    Status New      PT LONG TERM GOAL #3   Title Will report fatigue as being no more than 3/10 after a full 10 hour shift at work to show improved functional activity tolerance    Time 3    Period Weeks    Status New                    Plan - 07/07/21 1755     Clinical Impression Statement Infiniti arrives today after having a pons CVA in October 2022. She reports she is back to work, her main complaints are a bit of dizziness when having to move quickly at work, general fatigue, and L LE weakness; also endorses L hand numbness, L UE weakness, and issues dropping items since her stroke- I messaged Dr. Teresa Coombs about an OT referral  so these factors can be addressed as well. Exam reveals mild L LE weakness, very mild balance impairment, and mild dizziness with VOR testing (very close to baseline level of dizzy). While some of these impairments are typical post-CVA, I do feel that she would benefit from short bout of skilled PT services to help address and reduce areas of concern.    Personal Factors and Comorbidities Comorbidity  1;Time since onset of injury/illness/exacerbation;Profession    Examination-Activity Limitations Locomotion Level;Squat;Stairs;Carry;Reach Overhead    Examination-Participation Restrictions Cleaning;Community Activity;Occupation;Driving    Stability/Clinical Decision Making Stable/Uncomplicated    Clinical Decision Making Low    Rehab Potential Good    PT Frequency 2x / week    PT Duration 3 weeks    PT Treatment/Interventions ADLs/Self Care Home Management;Gait training;Stair training;Functional mobility training;Therapeutic activities;Therapeutic exercise;Balance training;Neuromuscular re-education;Patient/family education;Manual techniques;Energy conservation    PT Next Visit Plan give HEP; focus on higher level strength, balance, VOR exercises    PT Home Exercise Plan did not have time to give at eval    Consulted and Agree with Plan of Care Patient             Patient will benefit from skilled therapeutic intervention in order to improve the following deficits and impairments:  Decreased coordination, Dizziness, Decreased activity tolerance, Decreased balance, Decreased strength  Visit Diagnosis: Dizziness and giddiness  Muscle weakness (generalized)  Unsteadiness on feet     Problem List Patient Active Problem List   Diagnosis Date Noted   Right pontine stroke (HCC) 05/09/2021   IUD (intrauterine device) in place 11/16/2017   BMI 27.0-27.9,adult 11/16/2017   HTN (hypertension) 10/12/2017   GERD (gastroesophageal reflux disease) 10/12/2017   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. [place], [place], [address] Phone: [phone]   Fax:  [phone]  Name: Shadavia Dampier MRN: [id number] Date of Birth: Aug 05, 1975

## 2021-07-07 NOTE — Progress Notes (Signed)
occup

## 2021-07-15 ENCOUNTER — Encounter: Payer: Self-pay | Admitting: Physical Therapy

## 2021-07-15 ENCOUNTER — Ambulatory Visit: Payer: 59 | Admitting: Physical Therapy

## 2021-07-15 ENCOUNTER — Other Ambulatory Visit: Payer: Self-pay

## 2021-07-15 ENCOUNTER — Ambulatory Visit: Payer: 59 | Admitting: Occupational Therapy

## 2021-07-15 DIAGNOSIS — R2681 Unsteadiness on feet: Secondary | ICD-10-CM

## 2021-07-15 DIAGNOSIS — R42 Dizziness and giddiness: Secondary | ICD-10-CM

## 2021-07-15 DIAGNOSIS — M6281 Muscle weakness (generalized): Secondary | ICD-10-CM

## 2021-07-15 NOTE — Therapy (Signed)
[facility] [place] [facility] [address] [place], [place], [address] Phone: [phone]   Fax:  [phone]  Patient Details  Name: Abigail Porter MRN: [id number] Date of Birth: 10/25/1975 Referring Provider:  Diamantina Providence, FNP  Encounter Date: 07/15/2021  Ms. Matto arrives today with her usual interpreter- had lots of questions for me. Per interpreter today, she had a hard time understanding the interpreter that was present at PT eval and did not really understand what PT was discussing with her through that interpreter.   She reports today that her main concern is her head symptoms, which come and go depending on the day- she does check her BP in the morning but has not checked BP during one of those episodes. She feels 100% fine today and clarifies that her hand sx are not consistent either they come and go.   Her main concern is her head/dizziness- which does sound more medically/potentially BP related rather than coming from a source PT can definitely help.   She politely declines continuing with PT and requests that she be discharged at this point. I did recommend that she continue to f/u with MD regarding BP management.   No charge for today's session.   Madelaine Etienne, DPT, PN2   Supplemental Physical Therapist [facility]    Pager [phone] Acute Rehab Office [phone]   [facility] [place] [facility] [address] [place], [place], [address] Phone: [phone]   Fax:  [phone]

## 2021-07-15 NOTE — Therapy (Signed)
[place]. [place], [place], [address] Phone: [phone]   Fax:  [phone]  Patient Details  Name: Abigail Porter MRN: [id number] Date of Birth: 1976/01/06 Referring Provider:  No ref. provider found  Encounter Date: 07/15/2021   PHYSICAL THERAPY DISCHARGE SUMMARY  Visits from Start of Care: 2  Current functional level related to goals / functional outcomes: Requests DC at this time   Remaining deficits: Very mild strength and balance deficits    Education / Equipment: DC today per her request    Patient agrees to discharge. Patient goals were  technically not met- would consider them more deferred at this time however, see other note from today for additional details.   . Patient is being discharged due to the patient's request.  Ann Lions PT, DPT, PN2   Supplemental Physical Therapist [place]    Pager [phone] Acute Rehab Office [place]. [place], [place], [address] Phone: [phone]   Fax:  [phone]

## 2021-07-21 ENCOUNTER — Ambulatory Visit: Payer: 59 | Admitting: Physical Therapy

## 2021-07-27 ENCOUNTER — Ambulatory Visit: Payer: 59 | Admitting: Physical Therapy

## 2021-07-29 ENCOUNTER — Ambulatory Visit: Payer: 59 | Admitting: Physical Therapy

## 2021-09-01 ENCOUNTER — Telehealth: Payer: Self-pay | Admitting: Neurology

## 2021-09-01 NOTE — Telephone Encounter (Signed)
Patient sent advocate in to try to schedule an appointment because she is having dizzy spells and numbness in legs and arms. Patient is also requesting a refill for her stroke medication but I don't have the medication name.  Spoke to Dr. April Manson on teams and advised that advocate he recommended she continue PT and OT and call her primary. The advocate advised that the patient went once and it did not work. The advocate advised that the patient won't drive when she feels like this so she needs to get a ride and has difficulty finding transportation because she has no family here in the [place]. I suggested that she reach out to see if she can be approved for [facility] transportation to get her to her appointments.   The advocate will relay to the patient that she needs to contact her primary care physician today and schedule appt to continue PT and OT. The Pt is freaking out about her symptoms which the advocate stated brings on stress which then brings on these symptoms.   Dr. April Manson advised he will call the patient but she needs an interpreter so if a conference call is going to be done you will need to reach out to [facility] interpreter services between 8am-5pm at [phone] to request a specific interpreter Kendell Bane. This patient wants this interpreter who has been helping her with her care and knows her history.  Patient also wants Dr.Camara to fill out a from for her citizenship and I really am not sure what this had to do with medical and if this is something that you do but wanted to let you know it was mentioned when the advocate came into the office.

## 2021-09-06 NOTE — Telephone Encounter (Signed)
Spoke with patient via Interpretor [phone]. Advised her to continue with PT and OT for her dizziness and left arm weakness. She is comfortable with plans and will go back to the same PT office. Advised her to continue with Aspirin and statin as stroke prevention.  She will drop a form [phone] for immigration office. I inform patient that I will have tor review the form to determine if I can complete it. I will contact her if I need additional info.   Dr.Glendoris Nodarse

## 2021-09-07 DIAGNOSIS — Z0289 Encounter for other administrative examinations: Secondary | ICD-10-CM

## 2021-09-16 ENCOUNTER — Telehealth: Payer: Self-pay | Admitting: Neurology

## 2021-09-16 NOTE — Telephone Encounter (Signed)
Spoke with patient via interpreter, informed her that I will not be completing the form because her current conditions which are the pontine stroke, the numbness and the dizziness do not prevent her from reading [place], speaking [place] and writing in [place].  She voices understanding.  Windell Norfolk, MD

## 2022-06-08 ENCOUNTER — Ambulatory Visit: Payer: Self-pay | Admitting: Neurology

## 2022-07-12 ENCOUNTER — Ambulatory Visit (INDEPENDENT_AMBULATORY_CARE_PROVIDER_SITE_OTHER): Payer: Self-pay | Admitting: Neurology

## 2022-07-12 ENCOUNTER — Encounter: Payer: Self-pay | Admitting: Neurology

## 2022-07-12 VITALS — BP 147/101 | HR 88 | Ht 60.0 in | Wt 142.0 lb

## 2022-07-12 DIAGNOSIS — Q283 Other malformations of cerebral vessels: Secondary | ICD-10-CM

## 2022-07-12 DIAGNOSIS — I6350 Cerebral infarction due to unspecified occlusion or stenosis of unspecified cerebral artery: Secondary | ICD-10-CM

## 2022-07-12 MED ORDER — LOSARTAN POTASSIUM 50 MG PO TABS: 50.0000 mg | ORAL_TABLET | Freq: Every day | ORAL | 11 refills | Status: AC

## 2022-07-12 MED ORDER — UBRELVY 100 MG PO TABS: 1.0000 | ORAL_TABLET | ORAL | 1 refills | Status: AC | PRN

## 2022-07-12 MED ORDER — GABAPENTIN 100 MG PO CAPS: 100.0000 mg | ORAL_CAPSULE | ORAL | 0 refills | Status: DC | PRN

## 2022-07-12 NOTE — Patient Instructions (Addendum)
Continue current medications Refill for losartan given to patient Refill for Ubrelvy given Trial of gabapentin 100 mg at night for the left hand numbness Repeat CT angiogram for better assessment of the cavernous malformation Follow up in a year

## 2022-07-12 NOTE — Progress Notes (Signed)
[facility]  PATIENT: Abigail Porter DOB: July 15, 1976  REQUESTING CLINICIAN: Diamantina Providence, FNP HISTORY FROM: Patient via interpretor REASON FOR VISIT: Right pontine stroke   HISTORICAL  CHIEF COMPLAINT:  Chief Complaint  Patient presents with   Follow-up    Rm 13. Accompanied by interpreter. Pt would like to discuss medications causing dizziness. She is out of blood pressure medication, requesting refills.    INTERVAL HISTORY 07/12/2022 Patient presents today for follow-up, last visit was a year ago.  Since then she reports overall that she has been doing well but has also occasional headaches, starts in the back of the head and sometimes they are diffuse. She has tried [place] in the past with good relief and would like to continue the medication.  She reports she ran out of her losartan for blood pressure medication.   She also reports last week she had 1 episode of dizziness while in a car.  She was at a stop then the "entire world" was spinning.  This lasted less than a minute she has not had any recurrence of the dizziness. From her stroke she does have residual left hand numbness and weakness otherwise she is doing well.    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  Etiology of stroke likely small vessel disease, she was started on dual antiplatelet platelet aspirin and Plavix for 3 weeks and to continue with aspirin thereafter.  The rest of her work-up was within normal limits.  Since discharge from the hospital, patient stated that she is improving, she still have intermittent dizziness but better than before, still have some left-sided weakness and tingling.  No falls.     OTHER  MEDICAL CONDITIONS: HTN, HLD   REVIEW OF SYSTEMS: Full 14 system review of systems performed and negative with exception of: as noted in the HPI  ALLERGIES: No Known Allergies  HOME MEDICATIONS: Outpatient Medications Prior to Visit  Medication Sig Dispense Refill   aspirin 81 MG EC tablet Take 1 tablet (81 mg total) by mouth daily. Swallow whole. 90 tablet 5   atorvastatin (LIPITOR) 40 MG tablet Take 1 tablet (40 mg total) by mouth daily. 90 tablet 4   naproxen sodium (ALEVE) 220 MG tablet Take 220 mg by mouth daily as needed (headache).     omeprazole (PRILOSEC) 40 MG capsule Take 40 mg by mouth every morning.     losartan (COZAAR) 100 MG tablet Take 25 mg by mouth daily.     meloxicam (MOBIC) 7.5 MG tablet Take 7.5 mg by mouth daily.     Ubrogepant (UBRELVY) 100 MG TABS Take 1 tablet by mouth 2 (two) times daily as needed.     cyclobenzaprine (FLEXERIL) 5 MG tablet Take 5 mg by mouth See admin instructions. Tid x 10 days     No facility-administered medications prior to visit.    PAST MEDICAL HISTORY: Past Medical History:  Diagnosis Date   GERD (gastroesophageal reflux disease) 10/12/2017   HTN (hypertension) 10/12/2017    PAST SURGICAL HISTORY: Past Surgical History:  Procedure Laterality Date   INTRAUTERINE DEVICE (IUD) INSERTION      FAMILY HISTORY: Family History  Problem Relation Age of Onset   Hypertension Mother     SOCIAL HISTORY: Social History  Socioeconomic History   Marital status: Married    Spouse name: Not on file   Number of children: Not on file   Years of education: Not on file   Highest education level: Not on file  Occupational History   Not on file  Tobacco Use   Smoking status: Never   Smokeless tobacco: Never  Vaping Use   Vaping Use: Never used  Substance and Sexual Activity   Alcohol use: Never   Drug use: Never   Sexual activity: Not Currently    Birth control/protection: I.U.D.  Other Topics Concern   Not on file  Social  History Narrative   Not on file   Social Determinants of Health   Financial Resource Strain: Not on file  Food Insecurity: Not on file  Transportation Needs: Not on file  Physical Activity: Not on file  Stress: Not on file  Social Connections: Not on file  Intimate Partner Violence: Not on file     PHYSICAL EXAM  GENERAL EXAM/CONSTITUTIONAL: Vitals:  Vitals:   07/12/22 0916  BP: (!) 147/101  Pulse: 88  Weight: 142 lb (64.4 kg)  Height: 5' (1.524 m)   Body mass index is 27.73 kg/m. Wt Readings from Last 3 Encounters:  07/12/22 142 lb (64.4 kg)  06/08/21 145 lb 8 oz (66 kg)  11/16/17 142 lb 12 oz (64.8 kg)   Patient is in no distress; well developed, nourished and groomed; neck is supple  EYES: Visual fields full to confrontation, Extraocular movements intacts,   MUSCULOSKELETAL: Gait, strength, tone, movements noted in Neurologic exam below  NEUROLOGIC: MENTAL STATUS:      No data to display         awake, alert, oriented to person, place and time recent and remote memory intact normal attention and concentration language fluent, comprehension intact, naming intact fund of knowledge appropriate  CRANIAL NERVE:  2nd, 3rd, 4th, 6th - visual fields full to confrontation, extraocular muscles intact, no nystagmus 5th - facial sensation symmetric 7th - facial strength symmetric 8th - hearing intact 9th - palate elevates symmetrically, uvula midline 11th - shoulder shrug symmetric 12th - tongue protrusion midline  MOTOR:  normal bulk and tone, strength is normal in the BUE and BLE.    SENSORY:  Reports decrease pinprick to fingertips on the left   COORDINATION:  finger-nose-finger, fine finger movements normal  REFLEXES:  deep tendon reflexes present and symmetric  GAIT/STATION:  normal   DIAGNOSTIC DATA (LABS, IMAGING, TESTING) - I reviewed patient records, labs, notes, testing and imaging myself where available.  Lab Results  Component  Value Date   WBC 6.0 05/10/2021   HGB 14.5 05/10/2021   HCT 43.8 05/10/2021   MCV 87.8 05/10/2021   PLT 319 05/10/2021      Component Value Date/Time   NA 137 05/10/2021 0400   K 3.7 05/10/2021 0400   CL 107 05/10/2021 0400   CO2 22 05/10/2021 0400   GLUCOSE 139 (H) 05/10/2021 0400   BUN 12 05/10/2021 0400   CREATININE 0.81 05/10/2021 0400   CREATININE 0.74 11/16/2017 0927   CALCIUM 9.2 05/10/2021 0400   PROT 7.2 05/09/2021 1029   ALBUMIN 3.9 05/09/2021 1029   AST 16 05/09/2021 1029   ALT 16 05/09/2021 1029   ALKPHOS 40 05/09/2021 1029   BILITOT 0.7 05/09/2021 1029   GFRNONAA >60 05/10/2021 0400   Lab Results  Component Value Date   CHOL 207 (H) 05/10/2021   HDL 58 05/10/2021   LDLCALC  126 (H) 05/10/2021   TRIG 114 05/10/2021   CHOLHDL 3.6 05/10/2021   Lab Results  Component Value Date   HGBA1C 5.6 05/10/2021   No results found for: "VITAMINB12" No results found for: "TSH"   MRI Brain/MRA head 1. Acute right pontine infarct.  Mild edema without mass effect. 2. 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 Acute hemorrhage is thought unlikely given no hyperdensity on same day CT head. 3. Mild chronic microvascular ischemic disease   Echocardiogram  1. Left ventricular ejection fraction, by estimation, is 60 to 65%. The left ventricle has normal function. The left ventricle has no regional wall motion abnormalities. Left ventricular diastolic parameters are consistent with Grade II diastolic dysfunction (pseudonormalization).  2. Right ventricular systolic function is normal. The right ventricular size is normal.  3. Right atrial size was mildly dilated.  4. The mitral valve is normal in structure. No evidence of mitral valve regurgitation. No evidence of mitral stenosis.  5. The aortic valve is normal in structure. Aortic valve regurgitation is trivial. No aortic stenosis is  present.  6. The inferior vena cava is normal in size with greater than 50% respiratory variability, suggesting right atrial pressure of 3 mmHg.  7. Agitated saline contrast bubble study was negative, with no evidence of any interatrial shunt.    ASSESSMENT AND PLAN  46 y.o. year old female with vascular risk factors including hypertension and hyperlipidemia who presented for follow up for her right pontine stroke.  Overall she is doing well with some residual left hand numbness and subjective weakness. At this point I am going to give her a trial of low dose Gabapentin for the numbness and repeat the CT angiogram for better evaluation of the cavernous malformation. I will contact the patient to go over the results.  I also gave her a refill for her losartan and also Ubrelvy.  I will see her in 1 year for follow-up or sooner if worse.  Patient voiced understanding.   1. Right pontine stroke (HCC)   2. Cavernous malformation     Patient Instructions  Continue current medications Refill for losartan given to patient Refill for Ubrelvy given Trial of gabapentin 100 mg at night for the left hand numbness Repeat CT angiogram for better assessment of the cavernous malformation Follow up in a year    Orders Placed This Encounter  Procedures   CT ANGIO HEAD W OR WO CONTRAST    Meds ordered this encounter  Medications   losartan (COZAAR) 50 MG tablet    Sig: Take 1 tablet (50 mg total) by mouth daily.    Dispense:  30 tablet    Refill:  11   Ubrogepant (UBRELVY) 100 MG TABS    Sig: Take 1 tablet by mouth as needed.    Dispense:  48 tablet    Refill:  1   gabapentin (NEURONTIN) 100 MG capsule    Sig: Take 1 capsule (100 mg total) by mouth as needed for up to 15 doses.    Dispense:  15 capsule    Refill:  0    Return in about 1 year (around 07/13/2023).  I have spent a total of 36 minutes dedicated to this patient today, preparing to see patient, performing a medically  appropriate examination and evaluation, ordering tests and/or medications and procedures, and counseling and educating the patient/family/caregiver; independently interpreting result and communicating results to the family/patient/caregiver; and  documenting clinical information in the electronic medical record.   Windell Norfolk, MD 07/12/2022, 12:09 PM  [facility] [address] [place], [place] [address] [phone]

## 2022-07-13 ENCOUNTER — Telehealth: Payer: Self-pay | Admitting: Neurology

## 2022-07-13 NOTE — Telephone Encounter (Signed)
[employer] sent to GI they obtain auth [phone] 

## 2022-07-14 ENCOUNTER — Other Ambulatory Visit: Payer: Self-pay

## 2022-07-14 MED ORDER — GABAPENTIN 100 MG PO CAPS: 100.0000 mg | ORAL_CAPSULE | Freq: Every day | ORAL | 0 refills | Status: AC

## 2022-11-29 ENCOUNTER — Telehealth: Payer: Self-pay | Admitting: Neurology

## 2022-11-29 NOTE — Telephone Encounter (Signed)
Sent mychart msg informing pt of appt change due to provider schedule change 

## 2023-07-10 ENCOUNTER — Ambulatory Visit: Payer: Self-pay | Admitting: Neurology

## 2023-07-10 ENCOUNTER — Telehealth: Payer: Self-pay | Admitting: Neurology

## 2023-07-10 NOTE — Telephone Encounter (Signed)
Pt called to cx appt 

## 2023-07-11 ENCOUNTER — Ambulatory Visit: Payer: Self-pay | Admitting: Neurology

## 2024-01-30 ENCOUNTER — Ambulatory Visit: Payer: Self-pay | Admitting: Neurology

## 2024-05-23 ENCOUNTER — Institutional Professional Consult (permissible substitution): Payer: Self-pay | Admitting: Neurology

## 2024-09-23 ENCOUNTER — Institutional Professional Consult (permissible substitution): Payer: Self-pay | Admitting: Neurology

## 2024-09-26 ENCOUNTER — Institutional Professional Consult (permissible substitution): Payer: Self-pay | Admitting: Neurology
